# Patient Record
Sex: Male | Born: 1995 | Race: Black or African American | Hispanic: No | Marital: Single | Smoking: Former smoker
Health system: Southern US, Community
[De-identification: ages and names within clinical notes are randomized; demographics above are authoritative.]

## PROBLEM LIST (undated history)

## (undated) DIAGNOSIS — F32A Depression, unspecified: Secondary | ICD-10-CM

## (undated) DIAGNOSIS — F329 Major depressive disorder, single episode, unspecified: Secondary | ICD-10-CM

## (undated) DIAGNOSIS — N200 Calculus of kidney: Secondary | ICD-10-CM

## (undated) DIAGNOSIS — F419 Anxiety disorder, unspecified: Secondary | ICD-10-CM

## (undated) HISTORY — DX: Anxiety disorder, unspecified: F41.9

## (undated) HISTORY — DX: Calculus of kidney: N20.0

## (undated) HISTORY — DX: Depression, unspecified: F32.A

## (undated) HISTORY — DX: Major depressive disorder, single episode, unspecified: F32.9

---
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
## 2013-06-14 ENCOUNTER — Emergency Department (HOSPITAL_COMMUNITY)
Admission: EM | Admit: 2013-06-14 | Discharge: 2013-06-14 | Disposition: A | Discharge status: Home / Self Care | Payer: 59 | Attending: Emergency Medicine | Admitting: Emergency Medicine

## 2013-06-14 ENCOUNTER — Emergency Department (HOSPITAL_COMMUNITY): Payer: 59

## 2013-06-14 ENCOUNTER — Encounter (HOSPITAL_COMMUNITY): Payer: Self-pay | Admitting: Emergency Medicine

## 2013-06-14 DIAGNOSIS — Z87891 Personal history of nicotine dependence: Secondary | ICD-10-CM | POA: Insufficient documentation

## 2013-06-14 DIAGNOSIS — K469 Unspecified abdominal hernia without obstruction or gangrene: Secondary | ICD-10-CM | POA: Insufficient documentation

## 2013-06-14 DIAGNOSIS — N200 Calculus of kidney: Secondary | ICD-10-CM

## 2013-06-14 DIAGNOSIS — N1330 Unspecified hydronephrosis: Secondary | ICD-10-CM | POA: Insufficient documentation

## 2013-06-14 DIAGNOSIS — N132 Hydronephrosis with renal and ureteral calculous obstruction: Secondary | ICD-10-CM

## 2013-06-14 DIAGNOSIS — I498 Other specified cardiac arrhythmias: Secondary | ICD-10-CM | POA: Insufficient documentation

## 2013-06-14 DIAGNOSIS — R1110 Vomiting, unspecified: Secondary | ICD-10-CM | POA: Insufficient documentation

## 2013-06-14 DIAGNOSIS — N201 Calculus of ureter: Secondary | ICD-10-CM | POA: Insufficient documentation

## 2013-06-14 HISTORY — DX: Calculus of kidney: N20.0

## 2013-06-14 LAB — COMPREHENSIVE METABOLIC PANEL
ALT: 10 U/L (ref 0–53)
Alkaline Phosphatase: 60 U/L (ref 39–117)
CO2: 26 mEq/L (ref 19–32)
Calcium: 9.8 mg/dL (ref 8.4–10.5)
Chloride: 102 mEq/L (ref 96–112)
Total Bilirubin: 1.6 mg/dL — ABNORMAL HIGH (ref 0.3–1.2)
Total Protein: 7.6 g/dL (ref 6.0–8.3)

## 2013-06-14 LAB — URINE MICROSCOPIC-ADD ON

## 2013-06-14 LAB — CBC WITH DIFFERENTIAL/PLATELET
Basophils Absolute: 0 10*3/uL (ref 0.0–0.1)
Basophils Relative: 1 % (ref 0–1)
Eosinophils Absolute: 0.1 K/uL (ref 0.0–0.7)
Eosinophils Relative: 2 % (ref 0–5)
HCT: 44.9 % (ref 39.0–52.0)
Hemoglobin: 15.5 g/dL (ref 13.0–17.0)
Lymphocytes Relative: 42 % (ref 12–46)
Lymphs Abs: 2.9 10*3/uL (ref 0.7–4.0)
MCH: 30.6 pg (ref 26.0–34.0)
MCHC: 34.5 g/dL (ref 30.0–36.0)
MCV: 88.6 fL (ref 78.0–100.0)
Monocytes Absolute: 0.6 10*3/uL (ref 0.1–1.0)
Monocytes Relative: 9 % (ref 3–12)
Neutro Abs: 3.3 K/uL (ref 1.7–7.7)
Neutrophils Relative %: 47 % (ref 43–77)
Platelets: 177 K/uL (ref 150–400)
RBC: 5.07 MIL/uL (ref 4.22–5.81)
RDW: 12.9 % (ref 11.5–15.5)
WBC: 6.9 K/uL (ref 4.0–10.5)

## 2013-06-14 LAB — URINALYSIS, ROUTINE W REFLEX MICROSCOPIC
Bilirubin Urine: NEGATIVE
Glucose, UA: NEGATIVE mg/dL
Ketones, ur: NEGATIVE mg/dL
Leukocytes, UA: NEGATIVE
Nitrite: NEGATIVE
Protein, ur: NEGATIVE mg/dL
Specific Gravity, Urine: 1.022 (ref 1.005–1.030)
Urobilinogen, UA: 0.2 mg/dL (ref 0.0–1.0)
pH: 7.5 (ref 5.0–8.0)

## 2013-06-14 LAB — COMPREHENSIVE METABOLIC PANEL WITH GFR
AST: 17 U/L (ref 0–37)
Albumin: 4.3 g/dL (ref 3.5–5.2)
BUN: 15 mg/dL (ref 6–23)
Creatinine, Ser: 0.95 mg/dL (ref 0.50–1.35)
GFR calc Af Amer: >90 mL/min (ref >90)
GFR calc non Af Amer: >90 mL/min (ref >90)
Glucose, Bld: 107 mg/dL — ABNORMAL HIGH (ref 70–99)
Potassium: 3.7 meq/L (ref 3.7–5.3)
Sodium: 142 meq/L (ref 137–147)

## 2013-06-14 MED ORDER — ONDANSETRON 4 MG PO TBDP
8.0000 mg | ORAL_TABLET | Freq: Once | ORAL | Status: AC
  Administered 2013-06-14: 8 mg via ORAL
  Filled 2013-06-14: qty 2

## 2013-06-14 MED ORDER — FENTANYL CITRATE 0.05 MG/ML IJ SOLN
50.0000 ug | Freq: Once | INTRAMUSCULAR | Status: AC
  Administered 2013-06-14: 50 ug via INTRAVENOUS
  Filled 2013-06-14: qty 2

## 2013-06-14 MED ORDER — KETOROLAC TROMETHAMINE 30 MG/ML IJ SOLN
30.0000 mg | Freq: Once | INTRAMUSCULAR | Status: AC
  Administered 2013-06-14: 30 mg via INTRAVENOUS
  Filled 2013-06-14: qty 1

## 2013-06-14 MED ORDER — HYDROMORPHONE HCL PF 1 MG/ML IJ SOLN
1.0000 mg | Freq: Once | INTRAMUSCULAR | Status: AC
  Administered 2013-06-14: 1 mg via INTRAVENOUS
  Filled 2013-06-14: qty 1

## 2013-06-14 MED ORDER — PROMETHAZINE HCL 25 MG PO TABS: 25.0000 mg | ORAL_TABLET | Freq: Four times a day (QID) | ORAL | Status: AC | PRN

## 2013-06-14 MED ORDER — ONDANSETRON HCL 4 MG/2ML IJ SOLN
4.0000 mg | Freq: Once | INTRAMUSCULAR | Status: AC
  Administered 2013-06-14: 4 mg via INTRAVENOUS
  Filled 2013-06-14: qty 2

## 2013-06-14 MED ORDER — OXYCODONE-ACETAMINOPHEN 5-325 MG PO TABS: ORAL_TABLET | ORAL | Status: AC

## 2013-06-14 MED ORDER — SODIUM CHLORIDE 0.9 % IV BOLUS (SEPSIS)
1000.0000 mL | Freq: Once | INTRAVENOUS | Status: AC
  Administered 2013-06-14: 1000 mL via INTRAVENOUS

## 2013-06-14 MED ORDER — TAMSULOSIN HCL 0.4 MG PO CAPS: 0.4000 mg | ORAL_CAPSULE | Freq: Every day | ORAL | Status: DC

## 2013-06-14 NOTE — ED Notes (Signed)
Patient transported to CT 

## 2013-06-14 NOTE — Discharge Instructions (Signed)
Kidney Stones Kidney stones (urolithiasis) are deposits that form inside your kidneys. The intense pain is caused by the stone moving through the urinary tract. When the stone moves, the ureter goes into spasm around the stone. The stone is usually passed in the urine.  CAUSES   A disorder that makes certain neck glands produce too much parathyroid hormone (primary hyperparathyroidism).  A buildup of uric acid crystals, similar to gout in your joints.  Narrowing (stricture) of the ureter.  A kidney obstruction present at birth (congenital obstruction).  Previous surgery on the kidney or ureters.  Numerous kidney infections. SYMPTOMS   Feeling sick to your stomach (nauseous).  Throwing up (vomiting).  Blood in the urine (hematuria).  Pain that usually spreads (radiates) to the groin.  Frequency or urgency of urination. DIAGNOSIS   Taking a history and physical exam.  Blood or urine tests.  CT scan.  Occasionally, an examination of the inside of the urinary bladder (cystoscopy) is performed. TREATMENT   Observation.  Increasing your fluid intake.  Extracorporeal shock wave lithotripsy This is a noninvasive procedure that uses shock waves to break up kidney stones.  Surgery may be needed if you have severe pain or persistent obstruction. There are various surgical procedures. Most of the procedures are performed with the use of small instruments. Only small incisions are needed to accommodate these instruments, so recovery time is minimized. The size, location, and chemical composition are all important variables that will determine the proper choice of action for you. Talk to your health care provider to better understand your situation so that you will minimize the risk of injury to yourself and your kidney.  HOME CARE INSTRUCTIONS   Drink enough water and fluids to keep your urine clear or pale yellow. This will help you to pass the stone or stone fragments.  Strain  all urine through the provided strainer. Keep all particulate matter and stones for your health care provider to see. The stone causing the pain may be as small as a grain of salt. It is very important to use the strainer each and every time you pass your urine. The collection of your stone will allow your health care provider to analyze it and verify that a stone has actually passed. The stone analysis will often identify what you can do to reduce the incidence of recurrences.  Only take over-the-counter or prescription medicines for pain, discomfort, or fever as directed by your health care provider.  Make a follow-up appointment with your health care provider as directed.  Get follow-up X-rays if required. The absence of pain does not always mean that the stone has passed. It may have only stopped moving. If the urine remains completely obstructed, it can cause loss of kidney function or even complete destruction of the kidney. It is your responsibility to make sure X-rays and follow-ups are completed. Ultrasounds of the kidney can show blockages and the status of the kidney. Ultrasounds are not associated with any radiation and can be performed easily in a matter of minutes. SEEK MEDICAL CARE IF:  You experience pain that is progressive and unresponsive to any pain medicine you have been prescribed. SEEK IMMEDIATE MEDICAL CARE IF:   Pain cannot be controlled with the prescribed medicine.  You have a fever or shaking chills.  The severity or intensity of pain increases over 18 hours and is not relieved by pain medicine.  You develop a new onset of abdominal pain.  You feel faint or pass  out.  You are unable to urinate. MAKE SURE YOU:   Understand these instructions.  Will watch your condition.  Will get help right away if you are not doing well or get worse. Document Released: 03/11/2005 Document Revised: 11/11/2012 Document Reviewed: 08/12/2012 [facility] Patient Information 2014  [place], [place].      Diet for Kidney Stones Kidney stones are small, hard masses that form inside your kidneys. They are made up of salts and minerals and often form when high levels build up in the urine. The minerals can then start to build up, crystalize, and stick together to form stones. There are several different types of kidney stones. The following types of stones may be influenced by dietary factors:   Calcium Oxalate Stones. An oxalate is a salt found in certain foods. Within the body, calcium can combine with oxalates to form calcium oxalate stones, which can be excreted in the urine in high amounts. This is the most common type of kidney stone.  Calcium Phosphate Stones. These stones may occur when the pH of the urine becomes too high, or less acidic, from too much calcium being excreted in the urine. The pH is a measure of how acidic or basic a substance is.  Uric Acid Stones. This type of stone occurs when the pH of the urine becomes too low, or very acidic, because substances called purines build up in the urine. Purines are found in animal proteins. When the urine is highly concentrated with acid, uric acid kidney stones can form.  Other risk factors for kidney stones include genetics, environment, and being overweight. Your caregiver may ask you to follow specific diet guidelines based on the type of stone you have to lessen the chances of your body making more kidney stones.  GENERAL GUIDELINES FOR ALL TYPES OF STONES  Drink plenty of fluid. Drink 12 16 cups of fluid a day, drinking mainly water.This is the most important thing you can do to prevent the formation of future kidney stones.  Maintain a healthy weight. Your caregiver or dietitian can help you determine what a healthy weight is for you. If you are overweight, weight loss may help prevent the formation of future kidney stones.  Eat a diet adequate in animal protein. Too much animal protein can contribute to the  formation of stones. Your dietitian can help you determine how much protein you should be eating. Avoid low carbohydrate, high protein diets.  Follow a balanced eating approach. The DASH diet, which stands for "Dietary Approaches to Stop Hypertension," is an effective meal plan for reducing stone formation. This diet is high in fruits, vegetables, dairy, and whole grains and low in animal protein. Ask your caregiver or dietitian for information about the DASH diet. ADDITIONAL DIET GUIDELINES FOR CALCIUM STONES Avoid foods high in salt. This includes table salt, salt seasonings, MSG, soy sauce, cured and processed meats, salted crackers and snack foods, fast food, and canned soups and foods. Ask your caregiver or dietitian for information about reducing sodium in your diet or following the low sodium diet.  Ensure adequate calcium intake. Use the following table for calcium guidelines:  Men 64 years old and younger  1000 mg/day.  Men 44 years old and older  1500 mg/day.  Women 12 20 years old  1000 mg/day.  Women 50 years and older  1500 mg/day. Your dietitian can help you determine if you are getting enough calcium in your diet. Foods that are high in calcium include dairy products,  broccoli, cheese, yogurt, and pudding. If you need to take a calcium supplement, take it only in the form of calcium citrate.  Avoid foods high in oxalate. Be sure that any supplements you take do not contain more than 500 mg of vitamin C. Vitamin C is converted into oxalate in the body. You do not need to avoid fruits and vegetables high in vitamin C.   Grains: High-fiber or bran cereal, whole-wheat bread, grits, barley, buckwheat, amaranth, pretzels, and fruitcake.  Vegetables: Dried beans, wax beans, dark leafy greens, eggplant, leeks, okra, parsley, rutabaga, tomato paste, watercress, zucchini, and escarole.  Fruit: Dried apricots, red currants, figs, kiwi, and rhubarb.  Meat and Meat Substitutes: Soybeans and  foods made from soy (soyburger, miso), dried beans, peanut butter.  Milk: Chocolate milk mixes and soymilk.  Fats and Oils: Nuts (peanuts, almonds, pecans, cashews, hazelnuts) and nut butters, sesame seeds, and tDahini paste.  Condiments/Miscellaneous: Chocolate, carob, marmalade, poppy seeds, instant iced tea, and juice from high-oxalate fruits.  Document Released: 07/06/2010 Document Revised: 09/10/2011 Document Reviewed: 08/26/2011 [facility] Patient Information 2014 [place].    Narcotic and benzodiazepine use may cause drowsiness, slowed breathing or dependence.  Please use with caution and do not drive, operate machinery or watch young children alone while taking them.  Taking combinations of these medications or drinking alcohol will potentiate these effects.

## 2013-06-14 NOTE — ED Notes (Signed)
Pt reports woke up today with right lower abdominal pain with N/V. Pt actively vomiting in triage.

## 2013-06-14 NOTE — ED Provider Notes (Signed)
CSN: [id number]     Arrival date & time 06/14/13  0909 History   First MD Initiated Contact with Patient 06/14/13 0920     Chief Complaint  Patient presents with  . Abdominal Pain  . Emesis     (Consider location/radiation/quality/duration/timing/severity/associated sxs/prior Treatment) HPI Comments: Level 5 caveat due to active symptoms.  PT reports very soon after awakening this AM, had crampy severe pain on right side, flank and some in abdomen.  Some radiation of pain towards groin.  No prior h/o similar symptoms.  Pt has vomited about 4 times, no diarrhea.  Denies dysuria.  Unsure of fevers.  No meds tried prior to arrival.  Felt well going to bed last night.    Patient is a 18 y.o. male presenting with abdominal pain and vomiting. The history is provided by the patient. The history is limited by the condition of the patient.  Abdominal Pain Associated symptoms: vomiting   Emesis Associated symptoms: abdominal pain     History reviewed. No pertinent past medical history. History reviewed. No pertinent past surgical history. History reviewed. No pertinent family history. History  Substance Use Topics  . Smoking status: Former Games developer  . Smokeless tobacco: Not on file  . Alcohol Use: Not on file    Review of Systems  Unable to perform ROS: Acuity of condition  Gastrointestinal: Positive for vomiting and abdominal pain.      Allergies  Review of patient's allergies indicates no known allergies.  Home Medications   Current Outpatient Rx  Name  Route  Sig  Dispense  Refill  . oxyCODONE-acetaminophen (PERCOCET/ROXICET) 5-325 MG per tablet      1-2 tablets po q 6 hours prn moderate to severe pain   20 tablet   0   . promethazine (PHENERGAN) 25 MG tablet   Oral   Take 1 tablet (25 mg total) by mouth every 6 (six) hours as needed for nausea or vomiting.   20 tablet   0   . tamsulosin (FLOMAX) 0.4 MG CAPS capsule   Oral   Take 1 capsule (0.4 mg total) by mouth  daily after supper.   14 capsule   0    BP 139/83  Pulse 53  Temp(Src) 97.7 F (36.5 C)  Resp 20  Ht 6\' 2"  (1.88 m)  SpO2 99% Physical Exam  Nursing note and vitals reviewed. Constitutional: He appears well-developed and well-nourished. He is cooperative. He appears distressed.  Pt prefers to lay on stomach, writhes around on bed intermittently.    HENT:  Head: Normocephalic and atraumatic.  Eyes: Conjunctivae and EOM are normal. No scleral icterus.  Cardiovascular: Regular rhythm and intact distal pulses.  Bradycardia present.   Pulmonary/Chest: Effort normal.  Abdominal: Soft. Normal appearance. He exhibits no distension. There is tenderness. There is no CVA tenderness. A hernia is present.    Neurological: He is alert.  Skin: Skin is warm. He is not diaphoretic.    ED Course  Procedures (including critical care time) Labs Review Labs Reviewed  COMPREHENSIVE METABOLIC PANEL - Abnormal; Notable for the following:    Glucose, Bld 107 (*)    Total Bilirubin 1.6 (*)    All other components within normal limits  CBC WITH DIFFERENTIAL  URINALYSIS, ROUTINE W REFLEX MICROSCOPIC   Imaging Review Ct Abdomen Pelvis Wo Contrast  06/14/2013   CLINICAL DATA:  ABDOMINAL PAIN EMESIS  EXAM: CT ABDOMEN AND PELVIS WITHOUT CONTRAST  TECHNIQUE: Multidetector CT imaging of the abdomen and pelvis was  performed following the standard protocol without intravenous contrast.  COMPARISON:  None.  FINDINGS: The lung bases are.  The right kidney is edematous. There is minimal hydronephrosis and minimal hydroureter on the right. Within the distal right ureter a 3 mm calculus is appreciated. This finding is best appreciated on image 52 of the coronal reconstructions. There is no evidence of perinephric fluid collections nor free fluid.  Noncontrast evaluation of the liver, spleen, adrenals, pancreas, left kidney is unremarkable. There is no evidence of bowel obstruction, enteritis, colitis, diverticulitis  nor appendicitis. The appendix is identified and unremarkable.  There is no evidence of abdominal or pelvic free fluid, loculated fluid collections, masses, nor adenopathy within the limitations of a noncontrast CT.  There is no evidence of aggressive appearing osseous lesions, nor an abdominal wall or inguinal hernia.  A trace amount of free fluid is identified within the posterior aspect of the pelvis with the right likely reactive.  IMPRESSION: Distal right ureteral calculus with associated mild obstructive uropathy.  There is mild edema of the right kidney and correlation urinalysis is recommended, to evaluate for possible pyelonephritis.   Electronically Signed   By: Salome HolmesHector  Cooper M.D.   On: 06/14/2013 12:00     EKG Interpretation None     RA sat is 100% and I interpret to be normal  9:57 AM Pt is able to lay on back.  No guard or rebound at RLQ on exam.  Toradol helped mildly.  Will give additional analgesics as well.       12:14 PM Pt's pain improved, but waxes and wanes.  CT confirms right side ureteral stone.  Will refer to urology as outpt, Rx for pain, nausea and flomax given.    MDM   Final diagnoses:  Ureteral stone with hydronephrosis    Givne pt's colicky symptoms, laying on stomach indicates this is not peritoneal signs and doubt a surgical abdomen.  Will check UA, give IV meds and monitor.  Likely will need imaging for renal colic.         Gavin PoundMichael Y. Krue Peterka, MD 06/14/13 1215

## 2013-06-16 ENCOUNTER — Inpatient Hospital Stay (HOSPITAL_COMMUNITY)
Admission: RE | Admit: 2013-06-16 | Discharge: 2013-06-21 | DRG: 885 | Disposition: A | Discharge status: Home / Self Care | Payer: 59 | Attending: Psychiatry | Admitting: Psychiatry

## 2013-06-16 ENCOUNTER — Encounter (HOSPITAL_COMMUNITY): Payer: Self-pay | Admitting: *Deleted

## 2013-06-16 DIAGNOSIS — F329 Major depressive disorder, single episode, unspecified: Principal | ICD-10-CM | POA: Diagnosis present

## 2013-06-16 DIAGNOSIS — G4700 Insomnia, unspecified: Secondary | ICD-10-CM | POA: Diagnosis present

## 2013-06-16 DIAGNOSIS — F39 Unspecified mood [affective] disorder: Secondary | ICD-10-CM | POA: Diagnosis present

## 2013-06-16 DIAGNOSIS — Z87891 Personal history of nicotine dependence: Secondary | ICD-10-CM

## 2013-06-16 DIAGNOSIS — F4325 Adjustment disorder with mixed disturbance of emotions and conduct: Secondary | ICD-10-CM | POA: Diagnosis present

## 2013-06-16 DIAGNOSIS — F319 Bipolar disorder, unspecified: Secondary | ICD-10-CM

## 2013-06-16 DIAGNOSIS — F411 Generalized anxiety disorder: Secondary | ICD-10-CM | POA: Diagnosis present

## 2013-06-16 DIAGNOSIS — R45851 Suicidal ideations: Secondary | ICD-10-CM

## 2013-06-16 MED ORDER — TAMSULOSIN HCL 0.4 MG PO CAPS
0.4000 mg | ORAL_CAPSULE | Freq: Every day | ORAL | Status: DC
  Administered 2013-06-17 – 2013-06-18 (×2): 0.4 mg via ORAL
  Filled 2013-06-16 (×6): qty 1

## 2013-06-16 MED ORDER — ONDANSETRON 4 MG PO TBDP: 4.0000 mg | ORAL_TABLET | Freq: Three times a day (TID) | ORAL | Status: DC | PRN

## 2013-06-16 MED ORDER — KETOROLAC TROMETHAMINE 10 MG PO TABS: 10.0000 mg | ORAL_TABLET | Freq: Four times a day (QID) | ORAL | Status: AC | PRN

## 2013-06-16 MED ORDER — MAGNESIUM HYDROXIDE 400 MG/5ML PO SUSP: 30.0000 mL | Freq: Every day | ORAL | Status: DC | PRN

## 2013-06-16 MED ORDER — ALUM & MAG HYDROXIDE-SIMETH 200-200-20 MG/5ML PO SUSP: 30.0000 mL | ORAL | Status: DC | PRN

## 2013-06-16 MED ORDER — OXYCODONE-ACETAMINOPHEN 5-325 MG PO TABS: 1.0000 | ORAL_TABLET | Freq: Four times a day (QID) | ORAL | Status: DC | PRN

## 2013-06-16 MED ORDER — ACETAMINOPHEN 325 MG PO TABS
650.0000 mg | ORAL_TABLET | Freq: Four times a day (QID) | ORAL | Status: DC | PRN
Start: 2013-06-16 — End: 2013-06-22
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
## 2013-06-17 ENCOUNTER — Encounter (HOSPITAL_COMMUNITY): Payer: Self-pay | Admitting: *Deleted

## 2013-06-17 DIAGNOSIS — R45851 Suicidal ideations: Secondary | ICD-10-CM

## 2013-06-17 DIAGNOSIS — F319 Bipolar disorder, unspecified: Secondary | ICD-10-CM | POA: Insufficient documentation

## 2013-06-17 LAB — LIPID PANEL
Cholesterol: 125 mg/dL (ref 0–200)
HDL: 55 mg/dL (ref >39)
LDL Cholesterol: 57 mg/dL (ref 0–99)
Total CHOL/HDL Ratio: 2.3 RATIO
Triglycerides: 65 mg/dL (ref <150)
VLDL: 13 mg/dL (ref 0–40)

## 2013-06-17 LAB — CBC
HCT: 43.7 % (ref 39.0–52.0)
HEMOGLOBIN: 15.1 g/dL (ref 13.0–17.0)
MCH: 30.6 pg (ref 26.0–34.0)
MCHC: 34.6 g/dL (ref 30.0–36.0)
MCV: 88.6 fL (ref 78.0–100.0)
Platelets: 179 10*3/uL (ref 150–400)
RBC: 4.93 MIL/uL (ref 4.22–5.81)
RDW: 12.9 % (ref 11.5–15.5)
WBC: 7.2 10*3/uL (ref 4.0–10.5)

## 2013-06-17 LAB — COMPREHENSIVE METABOLIC PANEL
ALT: 9 U/L (ref 0–53)
AST: 15 U/L (ref 0–37)
Albumin: 4.2 g/dL (ref 3.5–5.2)
Alkaline Phosphatase: 56 U/L (ref 39–117)
BUN: 11 mg/dL (ref 6–23)
CALCIUM: 9.6 mg/dL (ref 8.4–10.5)
CO2: 30 mEq/L (ref 19–32)
CREATININE: 1.03 mg/dL (ref 0.50–1.35)
Chloride: 98 mEq/L (ref 96–112)
GFR calc Af Amer: >90 mL/min (ref >90)
GFR calc non Af Amer: >90 mL/min (ref >90)
GLUCOSE: 91 mg/dL (ref 70–99)
Potassium: 3.8 mEq/L (ref 3.7–5.3)
SODIUM: 137 meq/L (ref 137–147)
TOTAL PROTEIN: 7.1 g/dL (ref 6.0–8.3)
Total Bilirubin: 1.5 mg/dL — ABNORMAL HIGH (ref 0.3–1.2)

## 2013-06-17 LAB — BILIRUBIN, DIRECT: Bilirubin, Direct: 0.2 mg/dL (ref 0.0–0.3)

## 2013-06-17 LAB — TSH: TSH: 4.865 u[IU]/mL — AB (ref 0.350–4.500)

## 2013-06-17 MED ORDER — HYDROXYZINE HCL 50 MG PO TABS
50.0000 mg | ORAL_TABLET | Freq: Once | ORAL | Status: AC
  Administered 2013-06-17: 50 mg via ORAL
  Filled 2013-06-17 (×2): qty 1
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
## 2013-06-17 NOTE — B[facility] Group Notes (Signed)
[facility] LCSW Group Therapy  Living A Balanced Life  1:15 - 2: 30          06/17/2013    Type of Therapy:  Group Therapy  Participation Level:  Appropriate  Participation Quality:  Appropriate  Affect:  Appropriate  Cognitive:  Attentive Appropriate  Insight: Developing/Improving  Engagement in Therapy:  Developing/Improving  Modes of Intervention:  Discussion Exploration Problem-Solving Supportive   Summary of Progress/Problems: Topic for group was Living a Balanced Life.  Patient was able to show how life has become unbalanced.  He shared his life is out of balance due to spending too much time trying to get his family to accept him.   Patient able to  Identify appropriate coping skills.   Wynn BankerHodnett, Deshawnda Acrey Hairston 06/17/2013

## 2013-06-17 NOTE — Progress Notes (Signed)
Adult Psychoeducational Group Note  Date:  06/17/2013 Time:  10:00am Group Topic/Focus:  Making Healthy Choices:   The focus of this group is to help patients identify negative/unhealthy choices they were using prior to admission and identify positive/healthier coping strategies to replace them upon discharge.  Participation Level:    Participation Quality:    Affect:    Cognitive:    Insight:   Engagement in Group:    Modes of Intervention:    Additional Comments:Pt did not attend group   Pryor CuriaGarner, Wynter Grave D 06/17/2013, 10:53 PM
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
## 2013-06-17 NOTE — [facility] Suicide Risk Assessment (Signed)
[facility] INPATIENT:  Family/Significant Other Suicide Prevention Education  Suicide Prevention Education:  Patient Refusal for Family/Significant Other Suicide Prevention Education: The patient Jon SchlichterMitchell Wood has refused to provide written consent for family/significant other to be provided Family/Significant Other Suicide Prevention Education during admission and/or prior to discharge.  Physician notified.  Patient states he does not want to involve his parents in treatment due to his stress being associated with them.  Wynn BankerHodnett, Bow Buntyn Hairston 06/17/2013, 3:01 PM

## 2013-06-17 NOTE — Progress Notes (Signed)
Patient ID: Jon SchlichterMitchell Spillane, male   DOB: March 19, 1996, 18 y.o.   MRN: [id number] He has was in bed most of AM because he said that he had not slept well. He has been up and about this afternoon. He denies SI and Hi and pain.

## 2013-06-17 NOTE — Tx Team (Signed)
Initial Interdisciplinary Treatment Plan  PATIENT STRENGTHS: (choose at least two) Ability for insight Communication skills Physical Health Special hobby/interest  PATIENT STRESSORS: Marital or family conflict   PROBLEM LIST: Problem List/Patient Goals Date to be addressed Date deferred Reason deferred Estimated date of resolution  Depression 06/16/13     Relationship Problem with Parents 06/16/13                                                DISCHARGE CRITERIA:  Adequate post-discharge living arrangements Improved stabilization in mood, thinking, and/or behavior Reduction of life-threatening or endangering symptoms to within safe limits Verbal commitment to aftercare and medication compliance  PRELIMINARY DISCHARGE PLAN: Participate in family therapy Placement in alternative living arrangements  PATIENT/FAMIILY INVOLVEMENT: This treatment plan has been presented to and reviewed with the patient, Jon Wood, and/or family member.  The patient and family have been given the opportunity to ask questions and make suggestions.  Jon Wood, [facility] Wood [facility] 06/17/2013, 1:53 AM

## 2013-06-17 NOTE — Progress Notes (Signed)
Recreation Therapy Notes  Animal-Assisted Activity/Therapy (AAA/T) Program Checklist/Progress Notes Patient Eligibility Criteria Checklist & Daily Group note for Rec Tx Intervention  Date: 03.26.2015 Time: 2:45pm Location: [address] Dayroom    AAA/T Program Assumption of Risk Form signed by Patient/ or Parent Legal Guardian yes  Patient is free of allergies or sever asthma yes  Patient reports no fear of animals yes  Patient reports no history of cruelty to animals yes   Patient understands his/her participation is voluntary yes  Behavioral Response: DID NOT ATTEND.   Jon Wood L Jon Wood, LRT/CTRS  Jon Wood L 06/17/2013 4:40 PM 

## 2013-06-17 NOTE — [facility] Suicide Risk Assessment (Signed)
Suicide Risk Assessment  Admission Assessment     Nursing information obtained from:  Patient Demographic factors:  Male Current Mental Status:  NA Loss Factors:  Legal issues Historical Factors:    Risk Reduction Factors:    Total Time spent with patient: 45 minutes  CLINICAL FACTORS:   Severe Anxiety and/or Agitation Depression:   Aggression Anhedonia Hopelessness Impulsivity Insomnia Recent sense of peace/wellbeing Severe Unstable or Poor Therapeutic Relationship   COGNITIVE FEATURES THAT CONTRIBUTE TO RISK:  Closed-mindedness Loss of executive function Polarized thinking Thought constriction (tunnel vision)    SUICIDE RISK:   Moderate:  Frequent suicidal ideation with limited intensity, and duration, some specificity in terms of plans, no associated intent, good self-control, limited dysphoria/symptomatology, some risk factors present, and identifiable protective factors, including available and accessible social support.  PLAN OF CARE: Admit for crisis stabilization, safety monitoring and medication management depression, anxiety, agitation and suicidal ideation.  I certify that inpatient services furnished can reasonably be expected to improve the patient's condition.  Makayah Pauli,JANARDHAHA R. 06/17/2013, 5:33 PM

## 2013-06-18 DIAGNOSIS — F4325 Adjustment disorder with mixed disturbance of emotions and conduct: Secondary | ICD-10-CM

## 2013-06-18 DIAGNOSIS — F3160 Bipolar disorder, current episode mixed, unspecified: Secondary | ICD-10-CM

## 2013-06-18 LAB — RAPID URINE DRUG SCREEN, HOSP PERFORMED
AMPHETAMINES: NOT DETECTED
Barbiturates: NOT DETECTED
Benzodiazepines: NOT DETECTED
COCAINE: NOT DETECTED
OPIATES: NOT DETECTED
Tetrahydrocannabinol: POSITIVE — AB

## 2013-06-18 MED ORDER — NAPROXEN 500 MG PO TABS
250.0000 mg | ORAL_TABLET | Freq: Four times a day (QID) | ORAL | Status: DC | PRN
  Administered 2013-06-21: 250 mg via ORAL
  Filled 2013-06-18: qty 1

## 2013-06-18 MED ORDER — TRAMADOL HCL 50 MG PO TABS: 25.0000 mg | ORAL_TABLET | Freq: Four times a day (QID) | ORAL | Status: DC | PRN

## 2013-06-18 MED ORDER — CITALOPRAM HYDROBROMIDE 10 MG PO TABS
10.0000 mg | ORAL_TABLET | Freq: Every day | ORAL | Status: DC
  Administered 2013-06-18 – 2013-06-21 (×4): 10 mg via ORAL
  Filled 2013-06-18: qty 1
  Filled 2013-06-18: qty 3
  Filled 2013-06-18 (×5): qty 1

## 2013-06-18 MED ORDER — HYDROXYZINE HCL 25 MG PO TABS: 25.0000 mg | ORAL_TABLET | Freq: Four times a day (QID) | ORAL | Status: DC | PRN

## 2013-06-18 NOTE — Progress Notes (Signed)
D: Patient denies SI/HI and A/V hallucinations; patient reports that his anxiety and depression is good today; patient reports relief of pain   A: Monitored q 15 minutes; patient encouraged to attend groups; patient educated about medications; patient given medications per physician orders; patient encouraged to express feelings and/or concerns  R: Patient is very minimal and forwards little; patient is flat and blunted; patient was concerned about a patient on another hallway and when talking with him he was animated but staff he is blunted; patient was able to set goal to talk with staff 1:1 when having feelings of SI; patient is taking medications as prescribed and tolerating medications; patient is not attending any groups

## 2013-06-18 NOTE — Progress Notes (Signed)
D   Pt brightens on approach and is cooperative   He denies suicidal and homicidal ideation   He said the area where he hit his head was not bothering him   He did complain of anxiety and poor sleep last night A   Verbal support given   Medications administered and effectiveness monitored  Q 15 min checks R   Pt safe at present

## 2013-06-18 NOTE — Progress Notes (Signed)
[facility] MD Progress Note  06/18/2013 4:12 PM Lukus Binion  MRN:  [id number] Subjective:  Jon Wood is an 18 y.o. Male, high school graduate, part-time works in [employer], admitted voluntarily and emergently from [facility] emergency department with increased symptoms of depression, agitation, aggressive behaviors to himself and suicidal ideation. Patient was accompanied by his mother and father who stated that patient is attempting to jump out of a moving vehicle. Patient reports ongoing conflict with his parents especially her father. Patient was involved in a verbal altercation with his father today regarding a contract that he signed from his dad agreeing to the terms of a vehicle. Patient missed his curfew of midnight and had to give the vehicle back to his dad. Patient blames his parents are the root of his problems and turmoil. Patient states that he had a disagreement with his father today after they went to look for a vehicle. Patient reports that he became frustrated with his father because his father would not hear him out and decided to jump out of the moving vehicle that they were driving in. Patient reports that his father stopped the vehicle before he could jump out. He stated that he figured if he killed himself that nobody would have to worry about him because his parents told him that he creates problems when he is at [employer]. Patient states that suicide would solve his problem. Reportedly patient has been staying with his friend and friend's mother since August 2014 because his father does not want him to stay at home because he does not follow the rules of the home.Patient states that he hit his head on a cabinet door today after an altercation with his mom today and smashed his head with a tile the other day. Patient will denies HI and no AVH reported. Patient is unable to contract safety.   During today's assessment, pt rates anxiety at 6/10 and depression at 3/10. Pt states  many stressors including family dynamics and school concerns. Pt admits that he has been in denial about feeling anxious and depressed and that he has been guarded. Pt denies current SI, HI, and AVH, contracts for safety, but described in explicit detail his recent plans to jump out of the car with his dad driving and how his dad stopped him from doing so. Pt states that he wasn't thinking clearly at the time. Pt does admit to depression about the above stressors with intermittent emotional outbursts, especially in regard to family dynamics where he does not feel as if his family is prioritizing his needs. Pt has been hesitant to try medication, but after long discussion, pt is in agreement to try Celexa along with Vistaril low-dose for anxiety while the Celexa becomes therapeutic. Pt states his pain has been very severe and up to 8/10 but intermittent as his kidney stone passes. This has been verified with our [facility] records and pt is already on Flomax to pass the stone. Pain management for this condition has been initiated. Will continue to monitor patient for response to new medication changes.   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 No pertinent past medical history. Axis IV: other psychosocial or environmental problems and problems related to social environment Axis V: 41-50 serious symptoms  ADL's:  Intact  Sleep:  Good  Appetite:  Good  Suicidal Ideation:  Denies Homicidal Ideation:  Denies AEB (as evidenced by):  Psychiatric Specialty Exam: Physical Exam  Review of Systems  Psychiatric/Behavioral: Positive for depression. Negative for suicidal ideas and hallucinations. The patient is nervous/anxious. The patient does not have insomnia.     Blood pressure  109/85, pulse 72, temperature 97.5 F (36.4 C), temperature source Oral, resp. rate 18, height '6\' 2"'  (1.88 m), weight 89.812 kg (198 lb).Body mass index is 25.41 kg/(m^2).  General Appearance: Casual  Eye Contact::  Good  Speech:  Clear and Coherent  Volume:  Normal  Mood:  Anxious  Affect:  Appropriate  Thought Process:  Goal Directed  Orientation:  Full (Time, Place, and Person)  Thought Content:  WDL  Suicidal Thoughts:  No  Homicidal Thoughts:  No  Memory:  Immediate;   Good Recent;   Good Remote;   Good  Judgement:  Fair  Insight:  Fair  Psychomotor Activity:  Normal  Concentration:  Good  Recall:  [place] of Knowledge:Good  Language: Good  Akathisia:  NA  Handed:    AIMS (if indicated):     Assets:  Communication Skills Desire for Improvement Financial Resources/Insurance Housing Physical Health Resilience Social Support  Sleep:  Number of Hours: 4.25   Musculoskeletal: Strength & Muscle Tone: within normal limits Gait & Station: normal Patient leans: N/A  Current Medications: Current Facility-Administered Medications  Medication Dose Route Frequency Provider Last Rate Last Dose  . acetaminophen (TYLENOL) tablet 650 mg  650 mg Oral Q6H PRN Laverle Hobby, PA-C      . alum & mag hydroxide-simeth (MAALOX/MYLANTA) 200-200-20 MG/5ML suspension 30 mL  30 mL Oral Q4H PRN Laverle Hobby, PA-C      . magnesium hydroxide (MILK OF MAGNESIA) suspension 30 mL  30 mL Oral Daily PRN Laverle Hobby, PA-C      . ondansetron (ZOFRAN-ODT) disintegrating tablet 4 mg  4 mg Oral Q8H PRN Laverle Hobby, PA-C      . tamsulosin (FLOMAX) capsule 0.4 mg  0.4 mg Oral QPC supper Laverle Hobby, PA-C   0.4 mg at 06/17/13 6945    Lab Results:  Results for orders placed during the hospital encounter of 06/16/13 (from the past 48 hour(s))  LIPID PANEL     Status: None   Collection Time    06/17/13  6:45 AM      Result Value Ref Range   Cholesterol 125  0 - 200 mg/dL    Triglycerides 65  <150 mg/dL   HDL 55  >39 mg/dL   Total CHOL/HDL Ratio 2.3     VLDL 13  0 - 40 mg/dL   LDL Cholesterol 57  0 - 99 mg/dL   Comment:            Total Cholesterol/HDL:CHD Risk     Coronary Heart Disease Risk Table                         Men   Women      1/2 Average Risk   3.4   3.3      Average Risk       5.0   4.4      2 X Average Risk   9.6   7.1      3 X Average Risk  23.4   11.0  Use the calculated Patient Ratio     above and the CHD Risk Table     to determine the patient's CHD Risk.                ATP III CLASSIFICATION (LDL):      <100     mg/dL   Optimal      100-129  mg/dL   Near or Above                        Optimal      130-159  mg/dL   Borderline      160-189  mg/dL   High      >190     mg/dL   Very High     Performed at [facility]  TSH     Status: Abnormal   Collection Time    06/17/13  6:45 AM      Result Value Ref Range   TSH 4.865 (*) 0.350 - 4.500 uIU/mL   Comment: Performed at [facility]  CBC     Status: None   Collection Time    06/17/13  6:45 AM      Result Value Ref Range   WBC 7.2  4.0 - 10.5 K/uL   RBC 4.93  4.22 - 5.81 MIL/uL   Hemoglobin 15.1  13.0 - 17.0 g/dL   HCT 43.7  39.0 - 52.0 %   MCV 88.6  78.0 - 100.0 fL   MCH 30.6  26.0 - 34.0 pg   MCHC 34.6  30.0 - 36.0 g/dL   RDW 12.9  11.5 - 15.5 %   Platelets 179  150 - 400 K/uL   Comment: Performed at [facility] PANEL     Status: Abnormal   Collection Time    06/17/13  6:45 AM      Result Value Ref Range   Sodium 137  137 - 147 mEq/L   Potassium 3.8  3.7 - 5.3 mEq/L   Chloride 98  96 - 112 mEq/L   CO2 30  19 - 32 mEq/L   Glucose, Bld 91  70 - 99 mg/dL   BUN 11  6 - 23 mg/dL   Creatinine, Ser 1.03  0.50 - 1.35 mg/dL   Calcium 9.6  8.4 - 10.5 mg/dL   Total Protein 7.1  6.0 - 8.3 g/dL   Albumin 4.2  3.5 - 5.2 g/dL   AST 15  0 - 37 U/L   ALT 9  0 - 53 U/L   Alkaline Phosphatase 56  39 - 117  U/L   Total Bilirubin 1.5 (*) 0.3 - 1.2 mg/dL   GFR calc non Af Amer >90  >90 mL/min   GFR calc Af Amer >90  >90 mL/min   Comment: (NOTE)     The eGFR has been calculated using the CKD EPI equation.     This calculation has not been validated in all clinical situations.     eGFR's persistently <90 mL/min signify possible Chronic Kidney     Disease.     Performed at [place], DIRECT     Status: None   Collection Time    06/17/13  6:45 AM      Result Value Ref Range   Bilirubin, Direct 0.2  0.0 - 0.3 mg/dL   Comment: Performed at [place] [facility]  PERFORMED)     Status: Abnormal   Collection Time    06/17/13 10:59 PM      Result Value Ref Range   Opiates NONE DETECTED  NONE DETECTED   Cocaine NONE DETECTED  NONE DETECTED   Benzodiazepines NONE DETECTED  NONE DETECTED   Amphetamines NONE DETECTED  NONE DETECTED   Tetrahydrocannabinol POSITIVE (*) NONE DETECTED   Barbiturates NONE DETECTED  NONE DETECTED   Comment:            DRUG SCREEN FOR MEDICAL PURPOSES     ONLY.  IF CONFIRMATION IS NEEDED     FOR ANY PURPOSE, NOTIFY LAB     WITHIN 5 DAYS.                LOWEST DETECTABLE LIMITS     FOR URINE DRUG SCREEN     Drug Class       Cutoff (ng/mL)     Amphetamine      1000     Barbiturate      200     Benzodiazepine   956     Tricyclics       213     Opiates          300     Cocaine          300     THC              50     Performed at [facility]    Physical Findings: AIMS: Facial and Oral Movements Muscles of Facial Expression: None, normal Lips and Perioral Area: None, normal Jaw: None, normal Tongue: None, normal,Extremity Movements Upper (arms, wrists, hands, fingers): None, normal Lower (legs, knees, ankles, toes): None, normal, Trunk Movements Neck, shoulders, hips: None, normal, Overall Severity Severity of abnormal movements (highest score from questions above):  None, normal Incapacitation due to abnormal movements: None, normal Patient's awareness of abnormal movements (rate only patient's report): No Awareness, Dental Status Current problems with teeth and/or dentures?: No Does patient usually wear dentures?: No  CIWA:    COWS:     Treatment Plan Summary: Daily contact with patient to assess and evaluate symptoms and progress in treatment Medication management  Plan: Review of chart, vital signs, medications, and notes.  1-Individual and group therapy  2-Medication management for depression and anxiety: Medications reviewed with the patient and she stated no untoward effects.  -Add Vistaril 65m q6h PRN anxiety -Add Celexa 168mdaily for depression -Add Tramadol 2590m6h PRN with Naproxen 250m80mh PRN for flank pain (verified kidney stone, pt is already on flomax to pass stone, [facility]3-Coping skills for depression, anxiety  4-Continue crisis stabilization and management  5-Address health issues--monitoring vital signs, stable  6-Treatment plan in progress to prevent relapse of depression and anxiety  Medical Decision Making Problem Points:  Established problem, stable/improving (1), Review of last therapy session (1) and Review of psycho-social stressors (1) Data Points:  Review or order clinical lab tests (1) Review or order medicine tests (1) Review of medication regiment & side effects (2) Review of new medications or change in dosage (2)  I certify that inpatient services furnished can reasonably be expected to improve the patient's condition.   WithBenjamine MolaP-BC 06/18/2013, 4:12 PM  Reviewed the information documented and agree with the treatment plan.  Tasmin Exantus,JANARDHAHA R. 06/18/2013 6:25 PM

## 2013-06-18 NOTE — Progress Notes (Signed)
[facility] Group Notes:  (Nursing/MHT/Case Management/Adjunct)  Date:  06/18/2013  Time:  9:26 PM  Type of Therapy:  Group Therapy  Participation Level:  Active  Participation Quality:  Appropriate  Affect:  Appropriate  Cognitive:  Appropriate  Insight:  Appropriate  Engagement in Group:  Improving  Modes of Intervention:  Socialization and Support  Summary of Progress/Problems: Pt. Rated his energy level 4.  Pt. Stated he wanted to practise healthy communication to prevent relapse.  Sondra ComeWilson, Srijan Givan J 06/18/2013, 9:26 PM

## 2013-06-18 NOTE — [facility] Group Notes (Signed)
[facility] LCSW Group Therapy  06/18/2013 3:43 PM  Type of Therapy:  Group Therapy  Participation Level:  Minimal  Participation Quality:  limited  Affect:  Blunted  Cognitive:  Alert and Oriented  Insight:  Developing/Improving  Engagement in Therapy:  Engaged  Modes of Intervention:  Discussion, Exploration and Problem-solving  Summary of Progress/Problems:  Group today discussed recovery in means of substance abuse and mental health.  Members discussed what it feels like to be healthy and stable, supports that increase recovery and different ways of remaining in recovery.  Jon Wood joined the last 10 minutes of group. He reports his recovery started today as he was motivated to get up out of bed because of a kidney stone. 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 When he got out of high school, (2 years ago) he began to think for himself and has been in recovery with control over his life since. He reports his recent issues deal with relapse with his family and relationship dynamics causing depression and isolation.  He reports he still has not worked through these issues.  [name], [name] N 06/18/2013, 3:43 PM

## 2013-06-18 NOTE — [facility] Group Notes (Signed)
[facility]   06/18/2013 10:24 AM  Participation Quality:  Did not attend.  Patient sleeping in bed, would not wake up.    Jon Wood, Kinan Safley N

## 2013-06-19 DIAGNOSIS — F329 Major depressive disorder, single episode, unspecified: Principal | ICD-10-CM

## 2013-06-19 NOTE — [facility] Group Notes (Signed)
[facility] LCSW Group Therapy  06/19/2013 2:42 PM  Type of Therapy:  Group Therapy  Participation Level:  Did Not Attend   Summary of Progress/Problems:  Today's group consisted of a conversation around Supportive Framework: What is a supportive framework? What does it look like feel like and how do I discern it from and unhealthy non-supportive network? Learn how to cope when supports are not helpful and don't support you. Discuss what to do when your family/friends are not supportive.    [name], Lashawnda Hancox N 06/19/2013, 2:42 PM

## 2013-06-19 NOTE — Progress Notes (Signed)
Psychoeducational Group Note  Date: 06/19/2013 Time:  1015  Group Topic/Focus:  Identifying Needs:   The focus of this group is to help patients identify their personal needs that have been historically problematic and identify healthy behaviors to address their needs.  Participation Level:  Active  Participation Quality:  Appropriate  Affect:  Appropriate  Cognitive:  Oriented  Insight:  Improving  Engagement in Group:  Engaged  Additional Comments:    Martavius Lusty A  

## 2013-06-19 NOTE — Progress Notes (Signed)
[facility] MD Progress Note  06/19/2013 9:37 AM Jon Wood  MRN:  [id number] Subjective:  Met with the patient individually. He states he is "pretty chill" today and voices no new symptoms. He is able to converse about his poor dreams and his family history of depression. He talks about his father and his depression with suicide attempts. Jon Wood goes on to talk about how he feels when his family excludes him. His parents recently told him they are going to [place] soon. 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 No pertinent past medical history.  AXIS IV: other psychosocial or environmental problems, problems related to social environment and problems with primary support group  AXIS V: 41-50 serious symptoms Total Time spent with patient: 30 minutes    ADL's:  Intact  Sleep: Good  Appetite:  Good  Suicidal Ideation:  denies Homicidal Ideation:  demies AEB (as evidenced by):  Psychiatric Specialty Exam: Physical Exam  ROS  Blood pressure 146/90, pulse 101, temperature 97.6 F (36.4 C), temperature source Oral, resp. rate 18, height '6\' 2"'  (1.88 m), weight 89.812 kg (198 lb).Body mass index is 25.41 kg/(m^2).  General Appearance: Casual  Eye Contact::  Poor  Speech:  Clear and Coherent  Volume:  Normal  Mood:  Anxious and Depressed  Affect:  Congruent  Thought Process:  Goal Directed  Orientation:  Full (Time, Place, and Person)  Thought Content:  WDL  Suicidal Thoughts:  No  Homicidal Thoughts:  No  Memory:  NA  Judgement:  Poor  Insight:  Shallow  Psychomotor Activity:  Normal  Concentration:  Poor  Recall:  Poor  Fund of Knowledge:Poor  Language: Good  Akathisia:  No  Handed:  Right   AIMS (if indicated):     Assets:  Communication Skills Desire for Improvement Social Support  Sleep:  Number of Hours: 1.75   Musculoskeletal: Strength & Muscle Tone: within normal limits Gait & Station: normal Patient leans: N/A  Current Medications: Current Facility-Administered Medications  Medication Dose Route Frequency Provider Last Rate Last Dose  . acetaminophen (TYLENOL) tablet 650 mg  650 mg Oral Q6H PRN Laverle Hobby, PA-C      . alum & mag hydroxide-simeth (MAALOX/MYLANTA) 200-200-20 MG/5ML suspension 30 mL  30 mL Oral Q4H PRN Laverle Hobby, PA-C      . citalopram (CELEXA) tablet 10 mg  10 mg Oral Daily Benjamine Mola, FNP   10 mg at 06/18/13 1701  . hydrOXYzine (ATARAX/VISTARIL) tablet 25 mg  25 mg Oral Q6H PRN Benjamine Mola, FNP      . magnesium hydroxide (MILK OF MAGNESIA) suspension 30 mL  30 mL Oral Daily PRN Laverle Hobby, PA-C      . traMADol Veatrice [facility]) tablet 25 mg  25 mg Oral Q6H PRN Benjamine Mola, FNP       And  . naproxen (NAPROSYN) tablet 250 mg  250 mg Oral Q6H PRN Benjamine Mola, FNP      . ondansetron (ZOFRAN-ODT) disintegrating tablet 4 mg  4 mg Oral Q8H PRN Laverle Hobby, PA-C      . tamsulosin (FLOMAX) capsule 0.4 mg  0.4 mg Oral QPC supper Laverle Hobby, PA-C   0.4 mg at 06/18/13 1836    Lab Results:  Results  for orders placed during the hospital encounter of 06/16/13 (from the past 48 hour(s))  URINE RAPID DRUG SCREEN (HOSP PERFORMED)     Status: Abnormal   Collection Time    06/17/13 10:59 PM      Result Value Ref Range   Opiates NONE DETECTED  NONE DETECTED   Cocaine NONE DETECTED  NONE DETECTED   Benzodiazepines NONE DETECTED  NONE DETECTED   Amphetamines NONE DETECTED  NONE DETECTED   Tetrahydrocannabinol POSITIVE (*) NONE DETECTED   Barbiturates NONE DETECTED  NONE DETECTED   Comment:            DRUG SCREEN FOR MEDICAL PURPOSES     ONLY.  IF CONFIRMATION IS NEEDED     FOR ANY PURPOSE, NOTIFY LAB     WITHIN 5 DAYS.                 LOWEST DETECTABLE LIMITS     FOR URINE DRUG SCREEN     Drug Class       Cutoff (ng/mL)     Amphetamine      1000     Barbiturate      200     Benzodiazepine   341     Tricyclics       937     Opiates          300     Cocaine          300     THC              50     Performed at [facility]    Physical Findings: AIMS: Facial and Oral Movements Muscles of Facial Expression: None, normal Lips and Perioral Area: None, normal Jaw: None, normal Tongue: None, normal,Extremity Movements Upper (arms, wrists, hands, fingers): None, normal Lower (legs, knees, ankles, toes): None, normal, Trunk Movements Neck, shoulders, hips: None, normal, Overall Severity Severity of abnormal movements (highest score from questions above): None, normal Incapacitation due to abnormal movements: None, normal Patient's awareness of abnormal movements (rate only patient's report): No Awareness, Dental Status Current problems with teeth and/or dentures?: No Does patient usually wear dentures?: No  CIWA:    COWS:     Treatment Plan Summary: Daily contact with patient to assess and evaluate symptoms and progress in treatment Medication management  Plan: 1. Continue crisis management and stabilization. 2. Medication management to reduce current symptoms to base line and improve the patient's overall level of functioning. 3. Treat health problems as indicated. 4. Develop treatment plan to decrease risk of relapse upon discharge and to reduce the need for readmission. 5. Psycho-social education regarding relapse prevention and self care. 6. Health care follow up as needed for medical problems. 7. Restart home medications where appropriate. 8. Strain all urine. 9. T3 and T4 tonight.  Medical Decision Making Problem Points:  Established problem, stable/improving (1), New problem, with additional work-up planned (4) and Review of psycho-social stressors (1) Data Points:  Review or  order clinical lab tests (1) Review or order medicine tests (1)  I certify that inpatient services furnished can reasonably be expected to improve the patient's condition.  Marlane Hatcher. [facility] 06/19/2013 6:59 PM I agreed with the findings, treatment and disposition plan of this patient. Berniece Andreas, MD

## 2013-06-19 NOTE — Progress Notes (Signed)
Patient has been up and active on the unit, plans to attend group this evening. Patient currently denies having pain, -si/hi/a/v hall. He declined his 2000 flomax reporting that he feels that it causes vivid dreams and cotton mouth. Writer encouraged that he speak with his doctor about this medication.  Support and encouragement offered, safety maintained on unit, will continue to monitor.

## 2013-06-19 NOTE — Progress Notes (Signed)
Adult Psychoeducational Group Note  Date:  06/19/2013 Time:  9:23 PM  Group Topic/Focus:  Wrap-Up Group:   The focus of this group is to help patients review their daily goal of treatment and discuss progress on daily workbooks.  Participation Level:  Active  Participation Quality:  Appropriate  Affect:  Appropriate  Cognitive:  Appropriate  Insight: Appropriate  Engagement in Group:  Engaged  Modes of Intervention:  Discussion  Additional Comments: The patient expressed that he learn from group to love people and how to handle situations.  Octavio Mannshigpen, Daveah Varone Lee 06/19/2013, 9:23 PM

## 2013-06-19 NOTE — Progress Notes (Signed)
Writer has observed patient up in the dayroom watching tv and interacting with select peers. Writer spoke with him 1:1 and informed him of medications available if needed and he reports that he is fine and not needing any medication at hs. He currently denies si/hi/a/v hallucinations. Support and encouragement given, safety maintained on unit with 15 min checks.

## 2013-06-19 NOTE — Progress Notes (Signed)
D Thurmond ButtsWade is OOB UAL on the 500 hall tolerated well. HE is seen interacting frequently with his roommate, Ian MalkinZach. They laugh and support eaqch other throughout the day, overheard listening and offering each other suggestions with their problems. AHe completes his morning inventory and on it he writes he denies SI and  he rates his depression and hopelessness "5/4" . He is  Engaged in his recovery as evidenced by the questions he asks during his group today, his ability to process his unhealthy behaviors and his willingness to share with the group his plans. R safetyis in place and poc moves forward.

## 2013-06-19 NOTE — Progress Notes (Signed)
.  Psychoeducational Group Note    Date: 06/19/2013 Time: 0930   Goal Setting Purpose of Group: To be able to set a goal that is measurable and that can be accomplished in one day Participation Level:  Active  Participation Quality:  Attentive  Affect:  Appropriate  Cognitive:  Oriented  Insight:  Improving  Engagement in Group:  Engaged  Additional Comments:    Jon Wood A  

## 2013-06-20 LAB — T4, FREE: Free T4: 1.05 ng/dL (ref 0.80–1.80)

## 2013-06-20 LAB — T3, FREE: T3, Free: 3.5 pg/mL (ref 2.3–4.2)

## 2013-06-20 MED ORDER — HYDROXYZINE HCL 25 MG PO TABS: 25.0000 mg | ORAL_TABLET | Freq: Four times a day (QID) | ORAL | Status: DC | PRN

## 2013-06-20 NOTE — Progress Notes (Signed)
Patient has been up and active on the unit, attended group this evening and has voiced no complaints. Patient currently denies having pain, -si/hi/a/v hall. Patient and his room mate have been working on positive goals individually and together. He reports that at first he was not happy for coming here but now that he has gotten to know his room mate he is glad that he came. Support and encouragement offered, safety maintained on unit, will continue to monitor.

## 2013-06-20 NOTE — Progress Notes (Signed)
Adult Psychoeducational Group Note  Date:  06/20/2013 Time:  9:28 PM  Group Topic/Focus:  Wrap-Up Group:   The focus of this group is to help patients review their daily goal of treatment and discuss progress on daily workbooks.  Participation Level:  Active  Participation Quality:  Appropriate  Affect:  Appropriate  Cognitive:  Appropriate  Insight: Appropriate  Engagement in Group:  Engaged  Modes of Intervention:  Discussion  Additional Comments: The patient expressed that he was able to release anxiety at the gym.The patient said that he was able to help his peers today with advice.  Octavio Mannshigpen, Sugey Trevathan Lee 06/20/2013, 9:28 PM
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
## 2013-06-20 NOTE — Progress Notes (Signed)
Psychoeducational Group Note  Date:  06/20/2013 Time:  1015  Group Topic/Focus:  Making Healthy Choices:   The focus of this group is to help patients identify negative/unhealthy choices they were using prior to admission and identify positive/healthier coping strategies to replace them upon discharge.  Participation Level:  Active  Participation Quality:  Appropriate  Affect:  Appropriate  Cognitive:  Appropriate  Insight:  Improving  Engagement in Group:  Engaged  Additional Comments:    Jon Wood A 06/20/2013  

## 2013-06-20 NOTE — Progress Notes (Signed)
Psychoeducational Group Note  Date: 06/20/2013 Time:  0930  Group Topic/Focus:  Gratefulness:  The focus of this group is to help patients identify what two things they are most grateful for in their lives. What helps ground them and to center them on their work to their recovery.  Participation Level:  Active  Participation Quality:  Appropriate  Affect:  Flat  Cognitive:  Oriented  Insight:  Improving  Engagement in Group:  Engaged  Additional Comments:    Jazmeen Axtell A   

## 2013-06-20 NOTE — Progress Notes (Signed)
[name] is seen out in the milieu..he laughs and jokes and pals around with his roommate all day..he is invested in his poc and recovery as evidenced by the questions he asks in his LIfe SKIlls group and the comments he makes there, relating to the healthy changes he wants to make when he goes home.   A HE decides to take his daily anti depressants, after talking to the PA , Renata CapriceConrad and he completes his  Daily inventory and on it he writes he denies SI within the past 24 hrs.   R Safety is in place and poc includes changing bedtime sleep meds per PA.

## 2013-06-20 NOTE — [facility] Group Notes (Signed)
[facility] LCSW Group Therapy  06/20/2013   1:15 PM   Type of Therapy:  Group Therapy  Participation Level:  Active  Participation Quality:  Oriented and Attentive  Affect:  Calm  Cognitive:  Alert and Appropriate  Insight:  Developing/Improving and Engaged  Engagement in Therapy:  Developing/Improving and Engaged  Modes of Intervention:  Clarification, Confrontation, Discussion, Education, Exploration, Limit-setting, Orientation, Problem-solving, Rapport Building, Dance movement psychotherapisteality Testing, Socialization and Support  Summary of Progress/Problems: Today's group topic was avoiding self sabotage and enabling behaviors. Group members were asked to define self sabotage and enabling and provide examples. Group members were then asked to discuss unhealthy relationships and how to have positive healthy boundaries with those that enable. Group members were asked to process how communicating needs and establishing a plan to change the above identified behavior.  Pt shared that he struggles with feeling accepted by others, particularly his family.  Pt states that he feels he can't win with his family because they either say he isn't spending time with them or he is a problem when he is.  Pt states that he has learned to find support in friends instead.  Pt actively participated and was engaged in group discussion.    Jon IvanChelsea Horton, LCSW 06/20/2013 2:02 PM

## 2013-06-21 MED ORDER — TAMSULOSIN HCL 0.4 MG PO CAPS: 0.4000 mg | ORAL_CAPSULE | Freq: Every day | ORAL | Status: AC

## 2013-06-21 MED ORDER — CITALOPRAM HYDROBROMIDE 10 MG PO TABS: 10.0000 mg | ORAL_TABLET | Freq: Every day | ORAL | Status: DC

## 2013-06-21 NOTE — [facility] Suicide Risk Assessment (Signed)
   Demographic Factors:  Male, Adolescent or young adult and Low socioeconomic status  Total Time spent with patient: 30 minutes  Psychiatric Specialty Exam: Physical Exam  ROS  Blood pressure 130/88, pulse 84, temperature 97.9 F (36.6 C), temperature source Oral, resp. rate 18, height 6\' 2"  (1.88 m), weight 89.812 kg (198 lb).Body mass index is 25.41 kg/(m^2).  General Appearance: Fairly Groomed  Patent attorneyye Contact::  Fair  Speech:  Clear and Coherent  Volume:  Normal  Mood:  Anxious  Affect:  Appropriate and Congruent  Thought Process:  Coherent and Goal Directed  Orientation:  Full (Time, Place, and Person)  Thought Content:  WDL  Suicidal Thoughts:  No  Homicidal Thoughts:  No  Memory:  Immediate;   Good  Judgement:  Fair  Insight:  Fair  Psychomotor Activity:  Normal  Concentration:  Good  Recall:  Good  Fund of Knowledge:Good  Language: Good  Akathisia:  NA  Handed:  Right  AIMS (if indicated):     Assets:  Communication Skills Desire for Improvement Financial Resources/Insurance Housing Leisure Time Physical Health Resilience Social Support Talents/Skills Transportation Vocational/Educational  Sleep:  Number of Hours: 4.25    Musculoskeletal: Strength & Muscle Tone: within normal limits Gait & Station: normal Patient leans: N/A   Mental Status Per Nursing Assessment::   On Admission:  NA  Current Mental Status by Physician: NA  Loss Factors: Financial problems/change in socioeconomic status  Historical Factors: NA  Risk Reduction Factors:   Sense of responsibility to family, Religious beliefs about death, Employed, Living with another person, especially a relative, Positive social support, Positive therapeutic relationship and Positive coping skills or problem solving skills  Continued Clinical Symptoms:  Depression:   Recent sense of peace/wellbeing Unstable or Poor Therapeutic Relationship  Cognitive Features That Contribute To Risk:   Polarized thinking    Suicide Risk:  Minimal: No identifiable suicidal ideation.  Patients presenting with no risk factors but with morbid ruminations; may be classified as minimal risk based on the severity of the depressive symptoms  Discharge Diagnoses:   AXIS I:  Major Depression, single episode AXIS II:  Deferred AXIS III:  History reviewed. No pertinent past medical history. AXIS IV:  other psychosocial or environmental problems, problems related to social environment and problems with primary support group AXIS V:  61-70 mild symptoms  Plan Of Care/Follow-up recommendations:  Activity:  As tolerated Diet:  Regular  Is patient on multiple antipsychotic therapies at discharge:  No   Has Patient had three or more failed trials of antipsychotic monotherapy by history:  No  Recommended Plan for Multiple Antipsychotic Therapies: NA    Jon Wood,Jon R. 06/21/2013, 12:45 PM

## 2013-06-21 NOTE — [facility] Group Notes (Signed)
[facility]   06/21/2013 9:56 AM    Participation Quality:  Appropraite  Mood/Affect:  Appropriate  Depression Rating:  0  Anxiety Rating:  2  Thoughts of Suicide:  No  Will you contract for safety?   NA  Current AVH:  No  Plan for Discharge/Comments:  Patient attended discharge planning group and actively participated in group.  He reports being much better and hopes to discharge soon.  [facility].  CSW provided all participants with daily workbook.   Transportation Means: Patient has transportation.   Supports:  Patient has a support system.   Pluma Diniz, Joesph JulyQuylle Hairston

## 2013-06-21 NOTE — Progress Notes (Signed)
[facility] :  Will you be returning to the same living situation after discharge: Yes,  Patient is returing to his home. At discharge, do you have transportation home?:Yes,  Patient to contact parents for transportation home. Do you have the ability to pay for your medications:Yes,  Patient able to obtain mediaitons.  Release of information consent forms completed and in the chart;  Patient's signature needed at discharge.  Patient to Follow up at: Follow-up Information   [facility] On [facility]. (June 29, 2013 at [facility])    Contact information:   [address] [place], [place]   [address]  [phone]       Follow up with [facility], LCSW  On 06/24/2013. (Thursday, June 24, 2013 at 1:00 PM)    Contact information:   [address] [place], [place]   [address]  [phone]                                                                                                        [address] [place], [place]   [address]  [phone]           Patient denies SI/HI:   Patient no longer endorsing SI/HI or other thoughts of self harm.     Safety Planning and Suicide Prevention discussed: .Reviewed with all patients during discharge planning group   Shelsey Rieth, Joesph JulyQuylle Hairston 06/21/2013, 10:43 AM

## 2013-06-21 NOTE — Tx Team (Signed)
Interdisciplinary Treatment Plan Update   Date Reviewed:  06/21/2013  Time Reviewed:  9:42 AM  Progress in Treatment:   Attending groups: Yes Participating in groups: Yes Taking medication as prescribed: Yes  Tolerating medication: Yes Family/Significant other contact made:  No, patient declines collateral contact. Patient understands diagnosis: Yes  Discussing patient identified problems/goals with staff: Yes Medical problems stabilized or resolved: Yes Denies suicidal/homicidal ideation: Yes Patient has not harmed self or others: Yes  For review of initial/current patient goals, please see plan of care.  Estimated Length of Stay:  Discharge today  Reasons for Continued Hospitalization:   New Problems/Goals identified:    Discharge Plan or Barriers:   Home with outpatient [facility]  Additional Comments:  Attendees:  Patient:  Jon SchlichterMitchell Wood 06/21/2013 9:42 AM   Signature: Mervyn GayJ. Jonnalagadda, MD 06/21/2013 9:42 AM  Signature:   06/21/2013 9:42 AM  Signature:  Verne SpurrNeil Mashburn PA  06/21/2013 9:42 AM  Signature:Beverly Terrilee CroakKnight, RN 06/21/2013 9:42 AM  Signature:  Neill Loftarol Davis RN 06/21/2013 9:42 AM  Signature:  Juline PatchQuylle Marielle Mantione, LCSW 06/21/2013 9:42 AM  Signature:  Reyes Ivanhelsea Horton, LCSW 06/21/2013 9:42 AM  Signature:  Leisa LenzValerie Enoch, Care Coordinator [facility] 06/21/2013 9:42 AM  Signature:  Aloha GellKrista Dopson, RN 06/21/2013 9:42 AM  Signature:  06/21/2013  9:42 AM  Signature:   Onnie BoerJennifer Clark, RN [facility] 06/21/2013  9:42 AM  Signature:  06/21/2013  9:42 AM    Scribe for Treatment Team:   Juline PatchQuylle Anice Wilshire,  06/21/2013 9:42 AM
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
## 2013-06-21 NOTE — Progress Notes (Signed)
Patient ID: Jon SchlichterMitchell Accardo, male   DOB: 1995/11/23, 18 y.o.   MRN: [id number] D: pt. Denies SHI, [facility], meeting people and taking a part in the group. A: Writer introduced self to client, reviewed discharge instructions. Staff monitors q6315min for safety. R: Pt. Is safe on unit and able to reiterate discharge instructions. Items removed from locker. Pt.discharged to care of his father.

## 2013-06-24 NOTE — Progress Notes (Signed)
Patient Discharge Instructions:  After Visit Summary (AVS):   Faxed to:  06/24/13 Discharge Summary Note:   Faxed to:  06/24/13 Psychiatric Admission Assessment Note:   Faxed to:  06/24/13 Suicide Risk Assessment - Discharge Assessment:   Faxed to:  06/24/13 Faxed/Sent to the Next Level Care provider:  06/24/13 Next Level Care Provider Has Access to the EMR, 06/24/13 [facility]ax[facility] to [facility] @ [phone] Records provided to [facility] via [facility]/Epic access.  Jerelene ReddenSheena E [place], 06/24/2013, 1:16 PM

## 2013-06-29 ENCOUNTER — Ambulatory Visit (INDEPENDENT_AMBULATORY_CARE_PROVIDER_SITE_OTHER): Payer: 59 | Admitting: Psychiatry

## 2013-06-29 ENCOUNTER — Encounter (HOSPITAL_COMMUNITY): Payer: Self-pay | Admitting: Psychiatry

## 2013-06-29 VITALS — BP 138/90 | HR 60 | Ht 73.5 in | Wt 194.8 lb

## 2013-06-29 DIAGNOSIS — F329 Major depressive disorder, single episode, unspecified: Secondary | ICD-10-CM

## 2013-06-29 DIAGNOSIS — F411 Generalized anxiety disorder: Secondary | ICD-10-CM

## 2013-06-29 DIAGNOSIS — F332 Major depressive disorder, recurrent severe without psychotic features: Secondary | ICD-10-CM

## 2013-06-29 MED ORDER — CITALOPRAM HYDROBROMIDE 10 MG PO TABS: 10.0000 mg | ORAL_TABLET | Freq: Every day | ORAL | Status: AC
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
## 2013-06-30 ENCOUNTER — Encounter (HOSPITAL_COMMUNITY): Payer: Self-pay | Admitting: Psychiatry

## 2013-08-31 ENCOUNTER — Ambulatory Visit (HOSPITAL_COMMUNITY): Payer: Self-pay | Admitting: Psychiatry

## 2015-11-17 ENCOUNTER — Encounter (HOSPITAL_COMMUNITY): Payer: Self-pay

## 2015-11-17 ENCOUNTER — Emergency Department (HOSPITAL_COMMUNITY)
Admission: EM | Admit: 2015-11-17 | Discharge: 2015-11-17 | Disposition: A | Discharge status: Home / Self Care | Payer: No Typology Code available for payment source | Attending: Emergency Medicine | Admitting: Emergency Medicine

## 2015-11-17 ENCOUNTER — Emergency Department (HOSPITAL_COMMUNITY): Payer: No Typology Code available for payment source

## 2015-11-17 DIAGNOSIS — B353 Tinea pedis: Secondary | ICD-10-CM | POA: Diagnosis present

## 2015-11-17 DIAGNOSIS — Y999 Unspecified external cause status: Secondary | ICD-10-CM | POA: Insufficient documentation

## 2015-11-17 DIAGNOSIS — M25521 Pain in right elbow: Secondary | ICD-10-CM | POA: Insufficient documentation

## 2015-11-17 DIAGNOSIS — Y939 Activity, unspecified: Secondary | ICD-10-CM | POA: Diagnosis not present

## 2015-11-17 DIAGNOSIS — M25561 Pain in right knee: Secondary | ICD-10-CM | POA: Diagnosis not present

## 2015-11-17 DIAGNOSIS — W01198A Fall on same level from slipping, tripping and stumbling with subsequent striking against other object, initial encounter: Secondary | ICD-10-CM | POA: Diagnosis not present

## 2015-11-17 DIAGNOSIS — Y92512 Supermarket, store or market as the place of occurrence of the external cause: Secondary | ICD-10-CM | POA: Insufficient documentation

## 2015-11-17 DIAGNOSIS — M25511 Pain in right shoulder: Secondary | ICD-10-CM | POA: Insufficient documentation

## 2015-11-17 DIAGNOSIS — Z87891 Personal history of nicotine dependence: Secondary | ICD-10-CM | POA: Diagnosis not present

## 2015-11-17 NOTE — Discharge Instructions (Signed)
Please use antifungal medication, warm soaks with scrubbing. Please follow-up with primary care if symptoms continue to persist or worsen.

## 2015-11-17 NOTE — ED Notes (Signed)
Pt ambulated back to room from xray without difficulty

## 2015-11-17 NOTE — ED Provider Notes (Signed)
[facility]-EMERGENCY DEPT Provider Note   CSN: [id number] Arrival date & time: 11/17/15  1326  By signing my name below, I, Aggie Moats, attest that this documentation has been prepared under the direction and in the presence of [employer], PA-C. Electronically signed by: Aggie Moats, ED Scribe. 11/17/15. 3:43 PM.   History   Chief Complaint Chief Complaint  Patient presents with  . Fall    The history is provided by the patient. No language interpreter was used.  HPI Comments:  Jon Wood is a 20 y.o. male who presents to the Emergency Department complaining of moderate back, right shoulder, elbow and knee pain status post fall, which occurred yesterday morning. Pt reports that he also hit his head during the fall. Associated symptoms include spasms in knee if standing for too long. He reports that he has taken Ibuprofen, with temporary relief. Denies loss of consciousness or difficulty ambulating.    Past Medical History:  Diagnosis Date  . Anxiety   . Depression   . Kidney stone on right side 06/14/2013   waiting to pass stone    Patient Active Problem List   Diagnosis Date Noted  . MDD (major depressive disorder) (HCC) 06/29/2013  . Bipolar disorder, unspecified (HCC) 06/17/2013  . Suicide ideation 06/17/2013    History reviewed. No pertinent surgical history.     Home Medications    Prior to Admission medications   Medication Sig Start Date End Date Taking? Authorizing Provider  citalopram (CELEXA) 10 MG tablet Take 1 tablet (10 mg total) by mouth daily. For anxiety and depression. 06/29/13   Oletta Darter, MD  oxyCODONE-acetaminophen (PERCOCET/ROXICET) 5-325 MG per tablet 1-2 tablets po q 6 hours prn moderate to severe pain 06/14/13   Quita Skye, MD  promethazine (PHENERGAN) 25 MG tablet Take 1 tablet (25 mg total) by mouth every 6 (six) hours as needed for nausea or vomiting. 06/14/13   Quita Skye, MD  tamsulosin (FLOMAX) 0.4 MG CAPS capsule Take 1 capsule (0.4  mg total) by mouth daily after supper. 06/21/13   Tamala Julian, PA-C    Family History Family History  Problem Relation Age of Onset  . Depression Father   . Anxiety disorder Father     Social History Social History  Substance Use Topics  . Smoking status: Former Games developer  . Smokeless tobacco: Never Used  . Alcohol use No     Allergies   Review of patient's allergies indicates no known allergies.   Review of Systems Review of Systems  Musculoskeletal: Positive for arthralgias, back pain and myalgias.  Skin:       Fungal infection inferior to left foot.  All other systems reviewed and are negative.    Physical Exam Updated Vital Signs BP 129/90 (BP Location: Left Arm)   Pulse 67   Temp 98.3 F (36.8 C) (Oral)   Resp 16   Ht 6\' 3"  (1.905 m)   Wt 90.7 kg   SpO2 98%   BMI 25.00 kg/m   Physical Exam  Constitutional: He is oriented to person, place, and time. He appears well-developed and well-nourished. No distress.  HENT:  Head: Normocephalic.  Neck: Normal range of motion. Neck supple.  Pulmonary/Chest: Effort normal.  Musculoskeletal: Normal range of motion. He exhibits tenderness. He exhibits no edema.  Tenderness to palpation of the right surrounding scapular soft tissue. Tenderness to palpation of the right elbow diffusely No obvious signs of trauma. Full active ROM of shoulder elbow and lower extremities. Strength 5/5.  No C, T, or L spine tenderness to palpation. No obvious signs of trauma, deformity, Infection, step-offs. Lung expansion normal. No scoliosis or kyphosis. Bilateral lower extremity strength 5 out of 5, sensation grossly intact.  Straight leg negative.  Neurological: He is alert and oriented to person, place, and time.  Skin: Skin is warm and dry. He is not diaphoretic.  Psychiatric: He has a normal mood and affect. His behavior is normal. Judgment and thought content normal.  Nursing note and vitals reviewed.    ED Treatments /  Results  DIAGNOSTIC STUDIES:  Oxygen Saturation is 98% on room air, normal by my interpretation.    COORDINATION OF CARE:  3:22 PM Discussed treatment plan with pt at bedside and pt agreed to plan.  Labs (all labs ordered are listed, but only abnormal results are displayed) Labs Reviewed - No data to display  EKG  EKG Interpretation None       Radiology Dg Shoulder Right  Result Date: 11/17/2015 CLINICAL DATA:  Fall last week in grocery store. Right shoulder injury and pain. Initial encounter. EXAM: RIGHT SHOULDER - 2+ VIEW COMPARISON:  None. FINDINGS: There is no evidence of fracture or dislocation. There is no evidence of arthropathy or other focal bone abnormality. Soft tissues are unremarkable. IMPRESSION: Negative. Electronically Signed   By: Myles RosenthalJohn  Stahl M.D.   On: 11/17/2015 15:06   Dg Elbow Complete Right  Result Date: 11/17/2015 CLINICAL DATA:  Fall last week, right elbow pain EXAM: RIGHT ELBOW - COMPLETE 3+ VIEW COMPARISON:  None. FINDINGS: Four views of the right elbow submitted. No acute fracture or subluxation. No radiopaque foreign body. No posterior fat pad sign. IMPRESSION: Negative. Electronically Signed   By: Natasha MeadLiviu  Pop M.D.   On: 11/17/2015 15:05   Dg Knee Complete 4 Views Right  Result Date: 11/17/2015 CLINICAL DATA:  Fall last week in grocery store. Right knee pain. Initial encounter. EXAM: RIGHT KNEE - COMPLETE 4+ VIEW COMPARISON:  None. FINDINGS: No evidence of fracture, dislocation, or joint effusion. No evidence of arthropathy or other focal bone abnormality. Soft tissues are unremarkable. IMPRESSION: Negative. Electronically Signed   By: Myles RosenthalJohn  Stahl M.D.   On: 11/17/2015 15:05    Procedures Procedures (including critical care time)  Medications Ordered in ED Medications - No data to display   Initial Impression / Assessment and Plan / ED Course  I have reviewed the triage vital signs and the nursing notes.  Pertinent labs & imaging results that  were available during my care of the patient were reviewed by me and considered in my medical decision making (see chart for details).  Clinical Course    Labs:   Imaging:   Consults:   Therapeutics:   Discharge Meds:  Assessment/Plan: 20 year old male presents status post fall. Patient has no signs of trauma and exam, no bony tenderness. Patient given symptomatic care instructions, and strict return precautions, encouraged follow up with orthopedics if symptoms persist. He verbalized understanding and agreement to today's plan.  Patient left the emergency room prior to receiving discharge information   Final Clinical Impressions(s) / ED Diagnoses   Final diagnoses:  Tinea pedis of both feet    New Prescriptions Discharge Medication List as of 11/17/2015  3:02 PM     I personally performed the services described in this documentation, which was scribed in my presence. The recorded information has been reviewed and is accurate.     Eyvonne MechanicJeffrey Kanoe Wanner, PA-C 11/17/15 1639    Loren Raceravid Yelverton, MD  11/22/15 1238  

## 2015-11-17 NOTE — ED Notes (Signed)
Pt walked out of room with phone charger and stickers didn't speak to anyone and walked to the lobby.

## 2015-11-17 NOTE — ED Triage Notes (Signed)
Pt reports he fell last week in the grocery store, pt denies LOC just slipped. He reports back pain, right shoulder pain, knee and elbow pain. Pt ambulatory.

## 2018-05-04 IMAGING — DX DG KNEE COMPLETE 4+V*R*
4 series · 4 of 4 positions shown · non-contrast
Comparison: None.

CLINICAL DATA: Fall last week in grocery store. Right knee pain.
Initial encounter.

EXAM:
RIGHT KNEE - COMPLETE 4+ VIEW

[t knee ap right]
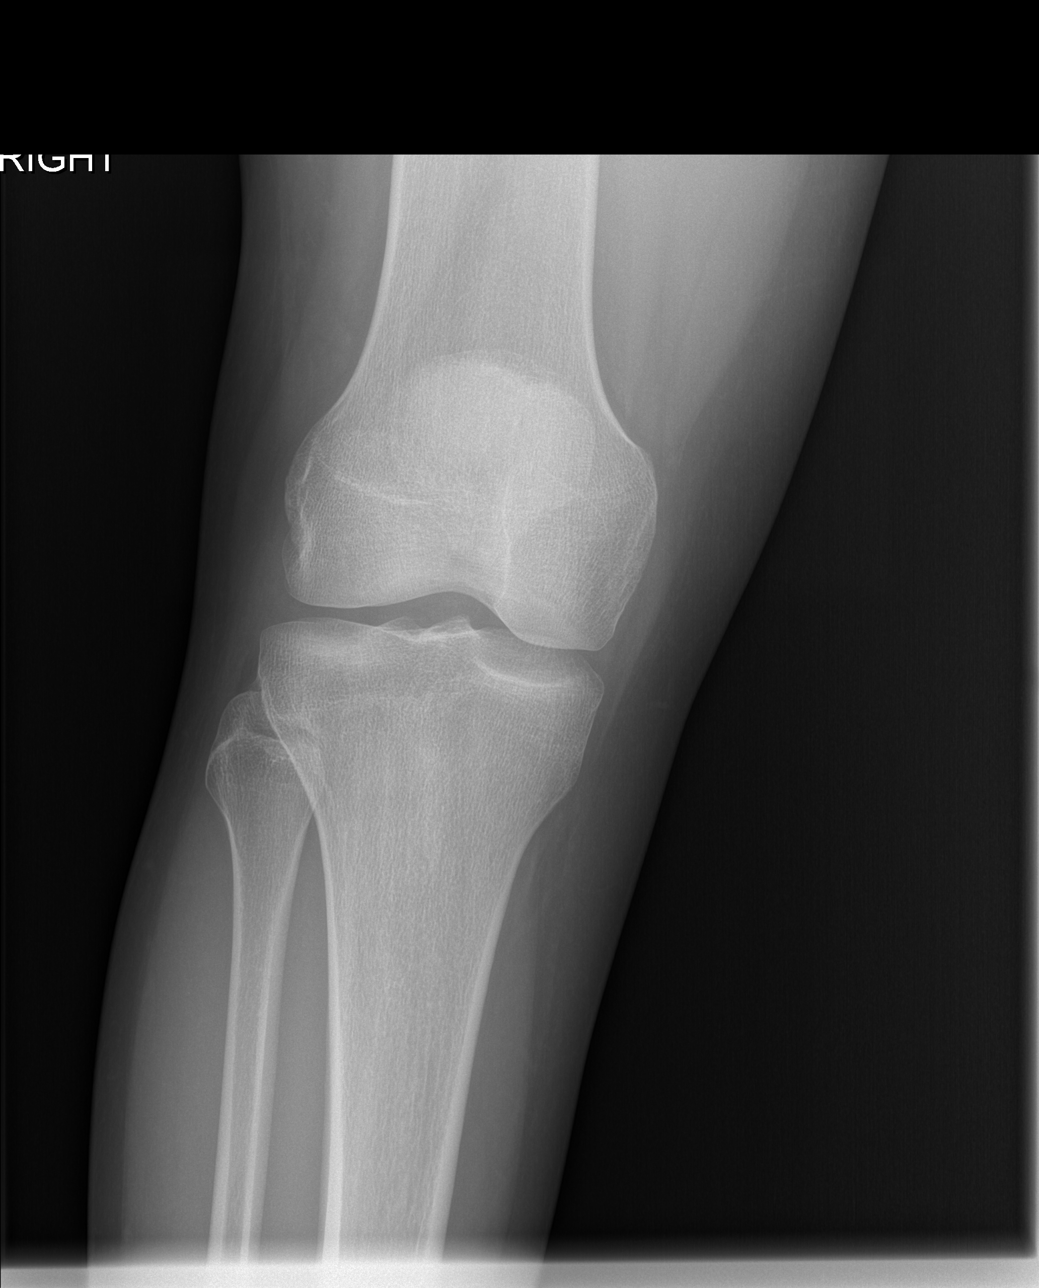

[t knee obl right]
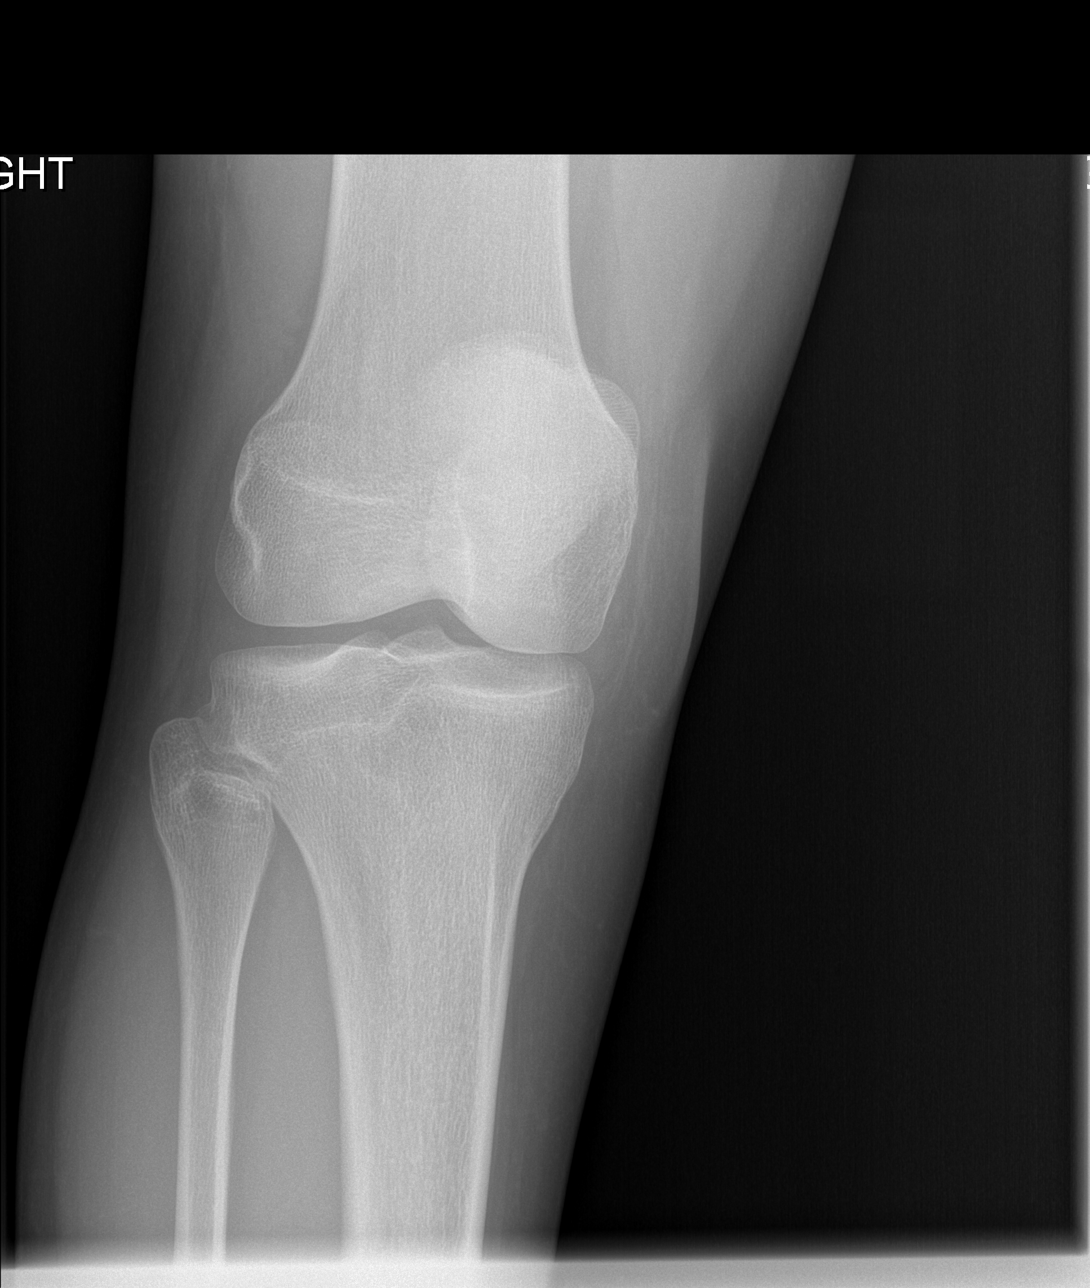

[t knee lat right]
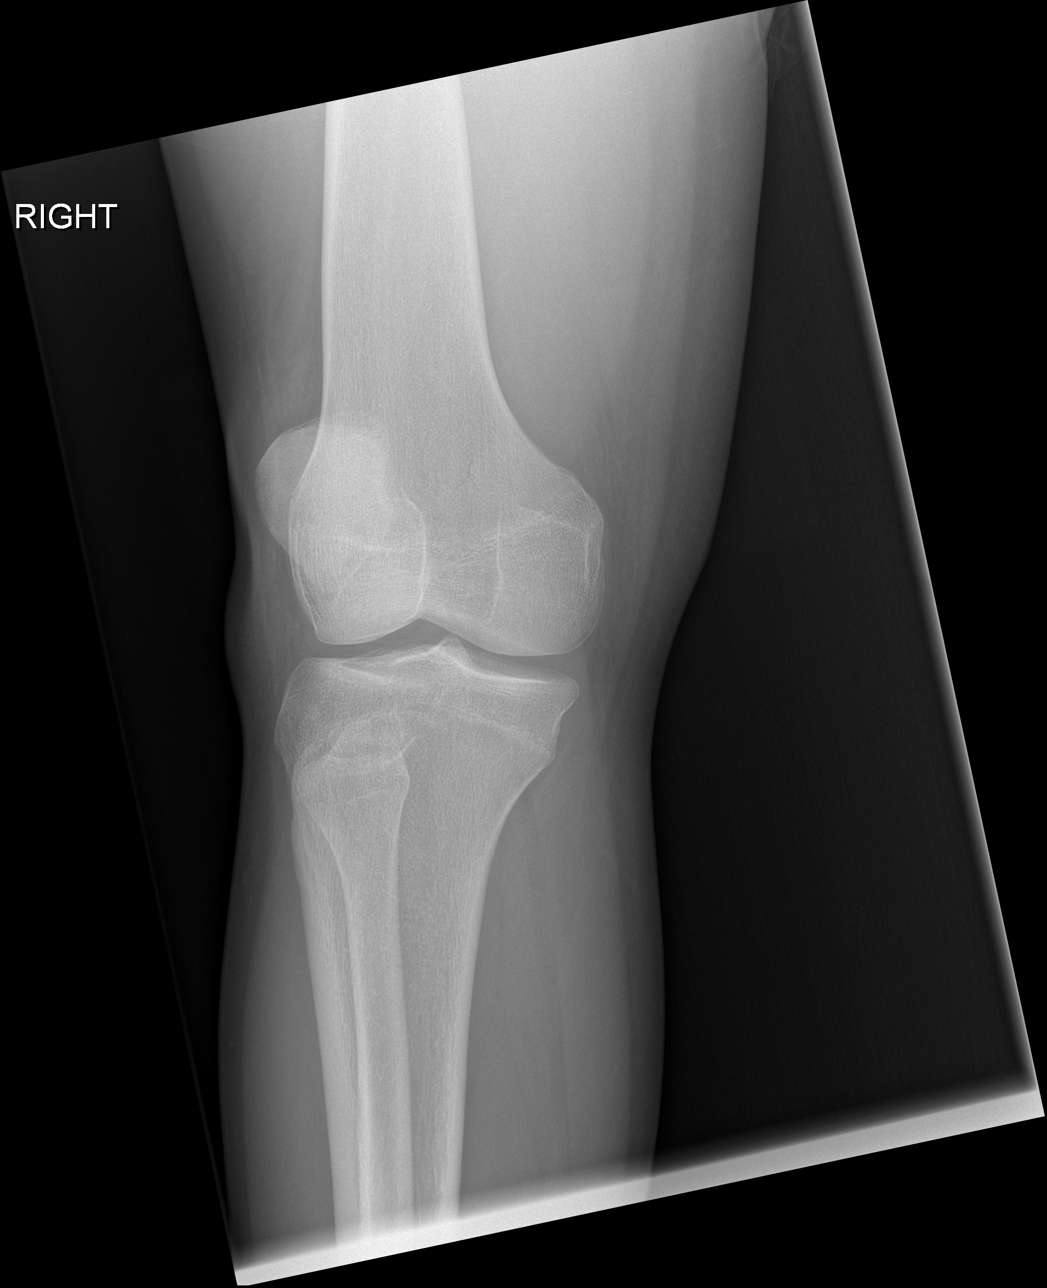

[t knee tunnel right]
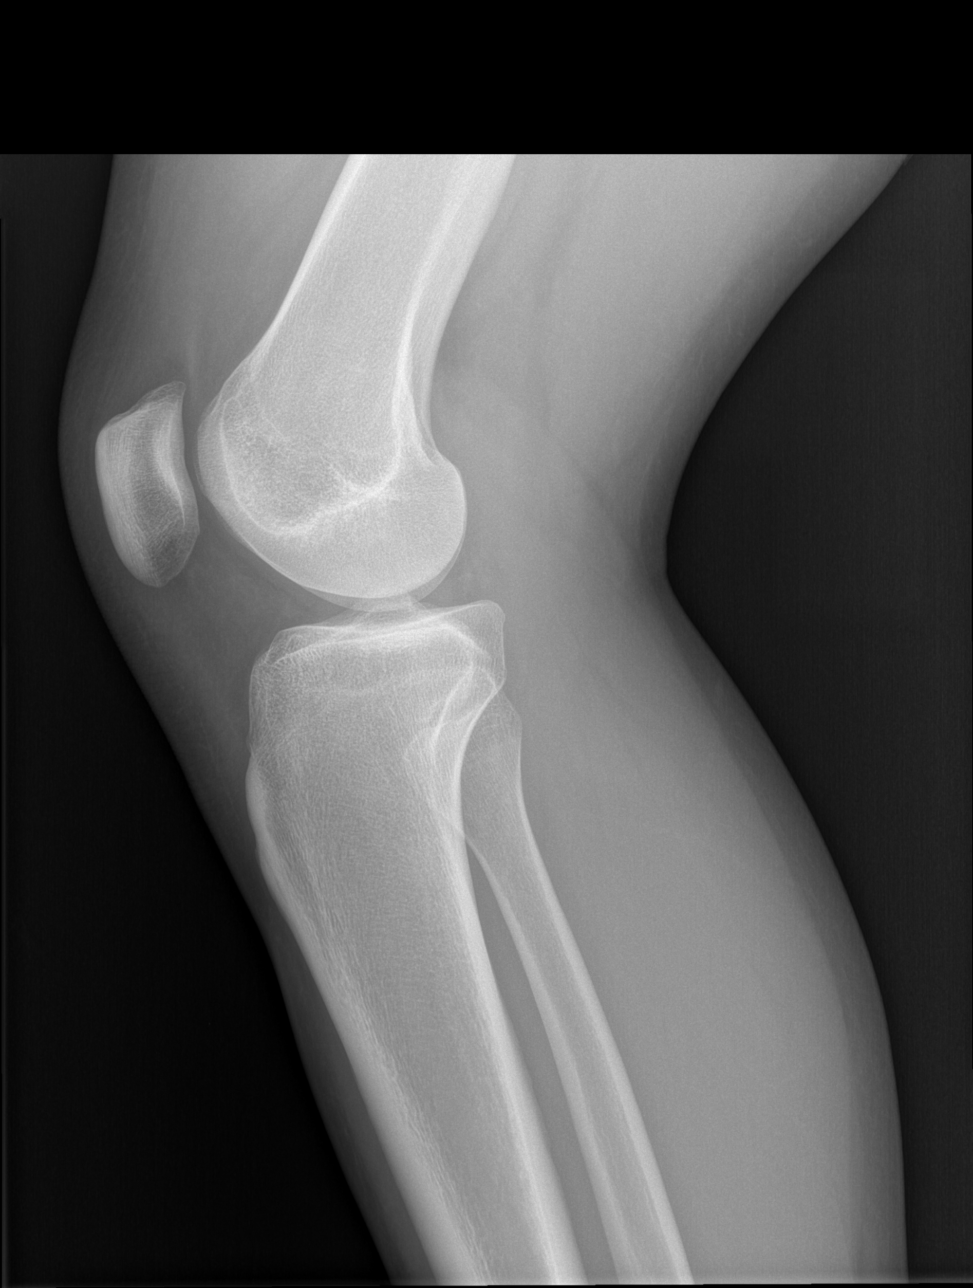

[4 of 4 positions shown; findings below may reference images not displayed]

FINDINGS: No evidence of fracture, dislocation, or joint effusion. No evidence
of arthropathy or other focal bone abnormality. Soft tissues are
unremarkable.
IMPRESSION: Negative.

## 2019-02-16 ENCOUNTER — Other Ambulatory Visit: Payer: Self-pay | Admitting: Sports Medicine

## 2019-02-16 ENCOUNTER — Ambulatory Visit (INDEPENDENT_AMBULATORY_CARE_PROVIDER_SITE_OTHER): Payer: 59

## 2019-02-16 ENCOUNTER — Ambulatory Visit: Payer: 59 | Admitting: Sports Medicine

## 2019-02-16 ENCOUNTER — Encounter: Payer: Self-pay | Admitting: Sports Medicine

## 2019-02-16 ENCOUNTER — Other Ambulatory Visit: Payer: Self-pay

## 2019-02-16 VITALS — BP 145/87

## 2019-02-16 DIAGNOSIS — M79671 Pain in right foot: Secondary | ICD-10-CM

## 2019-02-16 DIAGNOSIS — M79672 Pain in left foot: Secondary | ICD-10-CM | POA: Diagnosis not present

## 2019-02-16 DIAGNOSIS — M2142 Flat foot [pes planus] (acquired), left foot: Secondary | ICD-10-CM

## 2019-02-16 DIAGNOSIS — M2141 Flat foot [pes planus] (acquired), right foot: Secondary | ICD-10-CM | POA: Diagnosis not present

## 2019-02-16 MED ORDER — MELOXICAM 15 MG PO TABS: 15.0000 mg | ORAL_TABLET | Freq: Every day | ORAL | 0 refills | Status: DC

## 2019-02-16 NOTE — Progress Notes (Signed)
Subjective: Mihail Prettyman is a 23 y.o. male patient who presents to office for evaluation of Right> Left foot pain. Patient complains of progressive pain especially over the last year in the Right>Left foot that starts as fatigue in the medial arch. Ranks pain 6/10 and is now interferring with daily activities reports the pain is worse after work or after a period of activity especially after playing basketball.  Patient has also soaking icing over-the-counter anti-inflammatories Ace wrap's with no relief in symptoms.  Patient denies any other pedal complaints.   Review of Systems  All other systems reviewed and are negative.    Patient Active Problem List   Diagnosis Date Noted  . MDD (major depressive disorder) 06/29/2013  . Bipolar disorder, unspecified ([facility]) 06/17/2013  . Suicide ideation 06/17/2013   Current Outpatient Medications on File Prior to Visit  Medication Sig Dispense Refill  . citalopram (CELEXA) 10 MG tablet Take 1 tablet (10 mg total) by mouth daily. For anxiety and depression. 30 tablet 2  . oxyCODONE-acetaminophen (PERCOCET/ROXICET) 5-325 MG per tablet 1-2 tablets po q 6 hours prn moderate to severe pain 20 tablet 0  . promethazine (PHENERGAN) 25 MG tablet Take 1 tablet (25 mg total) by mouth every 6 (six) hours as needed for nausea or vomiting. 20 tablet 0  . tamsulosin (FLOMAX) 0.4 MG CAPS capsule Take 1 capsule (0.4 mg total) by mouth daily after supper. 14 capsule 0   No current facility-administered medications on file prior to visit.    No Known Allergies   Objective:  General: Alert and oriented x3 in no acute distress  Dermatology: No open lesions bilateral lower extremities, no webspace macerations, no ecchymosis bilateral, all nails x 10 are well manicured.  Vascular: Dorsalis Pedis and Posterior Tibial pedal pulses 2/4, Capillary Fill Time 3 seconds, (+) pedal hair growth bilateral, no edema bilateral lower extremities, Temperature gradient within normal  limits.  Neurology: Gross sensation intact via light touch bilateral, Protective sensation intact  with Semmes Weinstein Monofilament to all pedal sites, Position sense intact, vibratory intact bilateral, Deep tendon reflexes within normal limits bilateral, No babinski sign present bilateral. (+/-) Tinels sign right/left foot.   Musculoskeletal: Mild tenderness with palpation along medial arch, medial fascial band on Right>Left, mild tenderness along Posterior tibial tendon course with medial soft tissue buldge noted, there is decreased ankle rom with knee extending  vs flexed resembling gastroc equnius bilateral, Subtalar joint range of motion is within normal limits, there is no 1st ray hypermobility noted bilateral, decreased 1st MPJ rom Right>Left with functional limitus noted on weightbearing exam, there is medial arch collapse Right> Left on weightbearing exam,slight RF valgus Right> Left, no "too-many toes" sign appreciated, unable to perform heel rise test due to pain in right>left arch.   Gait: Antalgic gait with increased medial arch collapse and pronatory influence noted on Right> Left foot with medial 1st MPJ roll off in toe-off.   Xrays Right/Left foot:  Normal osseous mineralization. Joint spaces preserved. No fracture/dislocation/boney destruction. Mild 1st ray elevatus present. Increased Talar head uncovering present. Anterior break in cyma line with midtarsal breach present. Increased Talar declination present. Decreased calcaneal inclination present.  No soft tissue abnormalities or radiopaque foreign bodies.   Assessment and Plan: Problem List Items Addressed This Visit    None    Visit Diagnoses    Foot pain, bilateral    -  Primary   Relevant Orders   DG Foot Complete Right   DG Foot Complete Left  Pes planus of both feet          -Complete examination performed -Xrays reviewed -Discussed treatement options; discussed pes planus deformity;conservative and  surgical  including but not limited to the use of bone grafts, bone cuts, tendon  lengthening/reconstruction to achieve improvement in flatfoot deformity on Right>Left foot -Dispensed Ankle gaunlets to use until can be seen by Raiford Noble for custom orthotics -Rx Mobic to take as instructed -Patient to return to office for orthotics or sooner if condition worsens.  Asencion Islam, DPM

## 2019-03-01 ENCOUNTER — Ambulatory Visit (INDEPENDENT_AMBULATORY_CARE_PROVIDER_SITE_OTHER): Payer: 59 | Admitting: Orthotics

## 2019-03-01 ENCOUNTER — Other Ambulatory Visit: Payer: Self-pay | Admitting: Orthotics

## 2019-03-01 ENCOUNTER — Other Ambulatory Visit: Payer: Self-pay

## 2019-03-01 DIAGNOSIS — M2141 Flat foot [pes planus] (acquired), right foot: Secondary | ICD-10-CM | POA: Diagnosis not present

## 2019-03-01 DIAGNOSIS — M79671 Pain in right foot: Secondary | ICD-10-CM

## 2019-03-01 DIAGNOSIS — M2142 Flat foot [pes planus] (acquired), left foot: Secondary | ICD-10-CM | POA: Diagnosis not present

## 2019-03-01 NOTE — Progress Notes (Signed)
Patient is being seen today for f/o to address congential pes planus/pes planovalgus. Patient is active youth and demonstrates over pronation in gait, prominent medially shifted talus, and collapse of medial column.  Goals are RF stability, longitudinal arch support, decrease in pronation, and ease of discomfort in mobility related activities.   

## 2019-03-09 ENCOUNTER — Other Ambulatory Visit: Payer: Self-pay | Admitting: Sports Medicine

## 2019-03-09 NOTE — Telephone Encounter (Signed)
Dr. Stover please advice 

## 2019-05-14 ENCOUNTER — Encounter: Payer: Self-pay | Admitting: Sports Medicine

## 2019-05-21 ENCOUNTER — Encounter: Payer: 59 | Admitting: Orthotics

## 2023-01-03 DIAGNOSIS — S161XXA Strain of muscle, fascia and tendon at neck level, initial encounter: Secondary | ICD-10-CM | POA: Diagnosis not present

## 2023-01-03 DIAGNOSIS — S299XXA Unspecified injury of thorax, initial encounter: Secondary | ICD-10-CM | POA: Diagnosis not present

## 2023-01-03 DIAGNOSIS — M542 Cervicalgia: Secondary | ICD-10-CM | POA: Diagnosis not present

## 2023-01-03 DIAGNOSIS — M25512 Pain in left shoulder: Secondary | ICD-10-CM | POA: Diagnosis not present

## 2023-01-03 DIAGNOSIS — S46912A Strain of unspecified muscle, fascia and tendon at shoulder and upper arm level, left arm, initial encounter: Secondary | ICD-10-CM | POA: Diagnosis not present

## 2023-07-08 DIAGNOSIS — Z0000 Encounter for general adult medical examination without abnormal findings: Secondary | ICD-10-CM | POA: Diagnosis not present

## 2023-07-08 DIAGNOSIS — Z13220 Encounter for screening for lipoid disorders: Secondary | ICD-10-CM | POA: Diagnosis not present

## 2023-07-08 DIAGNOSIS — Z131 Encounter for screening for diabetes mellitus: Secondary | ICD-10-CM | POA: Diagnosis not present

## 2023-07-08 DIAGNOSIS — Z114 Encounter for screening for human immunodeficiency virus [HIV]: Secondary | ICD-10-CM | POA: Diagnosis not present

## 2023-07-08 DIAGNOSIS — Z23 Encounter for immunization: Secondary | ICD-10-CM | POA: Diagnosis not present

## 2023-08-13 DIAGNOSIS — F411 Generalized anxiety disorder: Secondary | ICD-10-CM | POA: Diagnosis not present

## 2023-09-02 DIAGNOSIS — E6689 Other obesity not elsewhere classified: Secondary | ICD-10-CM | POA: Diagnosis not present

## 2023-09-02 DIAGNOSIS — Z6833 Body mass index (BMI) 33.0-33.9, adult: Secondary | ICD-10-CM | POA: Diagnosis not present
